# Patient Record
Sex: Female | Born: 1935 | Race: White | Hispanic: No | Marital: Married | Smoking: Former smoker
Health system: Southern US, Community
[De-identification: ages and names within clinical notes are randomized; demographics above are authoritative.]

## PROBLEM LIST (undated history)

## (undated) DIAGNOSIS — D649 Anemia, unspecified: Secondary | ICD-10-CM

## (undated) DIAGNOSIS — I340 Nonrheumatic mitral (valve) insufficiency: Secondary | ICD-10-CM

## (undated) DIAGNOSIS — J449 Chronic obstructive pulmonary disease, unspecified: Secondary | ICD-10-CM

## (undated) DIAGNOSIS — R001 Bradycardia, unspecified: Secondary | ICD-10-CM

## (undated) DIAGNOSIS — K219 Gastro-esophageal reflux disease without esophagitis: Secondary | ICD-10-CM

## (undated) DIAGNOSIS — IMO0002 Reserved for concepts with insufficient information to code with codable children: Secondary | ICD-10-CM

## (undated) DIAGNOSIS — I4891 Unspecified atrial fibrillation: Secondary | ICD-10-CM

## (undated) DIAGNOSIS — R079 Chest pain, unspecified: Secondary | ICD-10-CM

## (undated) DIAGNOSIS — F209 Schizophrenia, unspecified: Secondary | ICD-10-CM

## (undated) DIAGNOSIS — G8929 Other chronic pain: Secondary | ICD-10-CM

## (undated) DIAGNOSIS — I350 Nonrheumatic aortic (valve) stenosis: Secondary | ICD-10-CM

## (undated) DIAGNOSIS — G2401 Drug induced subacute dyskinesia: Secondary | ICD-10-CM

## (undated) DIAGNOSIS — R609 Edema, unspecified: Secondary | ICD-10-CM

## (undated) DIAGNOSIS — I10 Essential (primary) hypertension: Secondary | ICD-10-CM

## (undated) DIAGNOSIS — F323 Major depressive disorder, single episode, severe with psychotic features: Secondary | ICD-10-CM

## (undated) HISTORY — DX: Reserved for concepts with insufficient information to code with codable children: IMO0002

## (undated) HISTORY — DX: Edema, unspecified: R60.9

## (undated) HISTORY — PX: OTHER SURGICAL HISTORY: SHX169

## (undated) HISTORY — PX: HERNIA REPAIR: SHX51

## (undated) HISTORY — DX: Essential (primary) hypertension: I10

## (undated) HISTORY — PX: LAMINECTOMY: SHX219

## (undated) HISTORY — DX: Bradycardia, unspecified: R00.1

## (undated) HISTORY — DX: Major depressive disorder, single episode, severe with psychotic features: F32.3

## (undated) HISTORY — DX: Nonrheumatic mitral (valve) insufficiency: I34.0

---

## 2009-03-03 ENCOUNTER — Encounter: Payer: Self-pay | Admitting: Internal Medicine

## 2009-03-04 ENCOUNTER — Encounter: Admission: RE | Admit: 2009-03-04 | Discharge: 2009-03-04 | Payer: Self-pay | Admitting: Family Medicine

## 2009-03-09 DIAGNOSIS — I4891 Unspecified atrial fibrillation: Secondary | ICD-10-CM

## 2009-03-09 DIAGNOSIS — R609 Edema, unspecified: Secondary | ICD-10-CM | POA: Insufficient documentation

## 2009-03-09 DIAGNOSIS — R000 Tachycardia, unspecified: Secondary | ICD-10-CM | POA: Insufficient documentation

## 2009-03-10 ENCOUNTER — Ambulatory Visit: Payer: Self-pay | Admitting: Internal Medicine

## 2009-03-10 DIAGNOSIS — F17200 Nicotine dependence, unspecified, uncomplicated: Secondary | ICD-10-CM | POA: Insufficient documentation

## 2009-03-12 ENCOUNTER — Encounter: Admission: RE | Admit: 2009-03-12 | Discharge: 2009-03-12 | Payer: Self-pay | Admitting: Family Medicine

## 2009-03-15 ENCOUNTER — Ambulatory Visit: Payer: Self-pay | Admitting: Internal Medicine

## 2009-03-15 LAB — CONVERTED CEMR LAB: POC INR: 2.7

## 2009-03-19 ENCOUNTER — Encounter: Payer: Self-pay | Admitting: Internal Medicine

## 2009-03-19 ENCOUNTER — Ambulatory Visit: Payer: Self-pay | Admitting: Internal Medicine

## 2009-03-19 ENCOUNTER — Ambulatory Visit: Payer: Self-pay

## 2009-03-19 ENCOUNTER — Ambulatory Visit (HOSPITAL_COMMUNITY): Admission: RE | Admit: 2009-03-19 | Discharge: 2009-03-19 | Payer: Self-pay | Admitting: Internal Medicine

## 2009-03-22 ENCOUNTER — Ambulatory Visit: Payer: Self-pay | Admitting: Cardiology

## 2009-03-22 LAB — CONVERTED CEMR LAB: POC INR: 4.8

## 2009-03-29 ENCOUNTER — Ambulatory Visit: Payer: Self-pay | Admitting: Internal Medicine

## 2009-03-29 LAB — CONVERTED CEMR LAB: POC INR: 2.3

## 2009-04-02 ENCOUNTER — Ambulatory Visit: Payer: Self-pay | Admitting: Internal Medicine

## 2009-04-02 LAB — CONVERTED CEMR LAB
ALT: 15 units/L (ref 0–35)
AST: 19 units/L (ref 0–37)
Albumin: 4.2 g/dL (ref 3.5–5.2)
Alkaline Phosphatase: 109 units/L (ref 39–117)
BUN: 9 mg/dL (ref 6–23)
Basophils Absolute: 0 10*3/uL (ref 0.0–0.1)
Basophils Relative: 0.8 % (ref 0.0–3.0)
Bilirubin, Direct: 0.2 mg/dL (ref 0.0–0.3)
CO2: 30 meq/L (ref 19–32)
Calcium: 9.4 mg/dL (ref 8.4–10.5)
Chloride: 107 meq/L (ref 96–112)
Creatinine, Ser: 0.6 mg/dL (ref 0.4–1.2)
Eosinophils Absolute: 0.1 10*3/uL (ref 0.0–0.7)
Eosinophils Relative: 1.5 % (ref 0.0–5.0)
GFR calc non Af Amer: 103.63 mL/min (ref >60)
Glucose, Bld: 91 mg/dL (ref 70–99)
HCT: 38.9 % (ref 36.0–46.0)
Hemoglobin: 12.9 g/dL (ref 12.0–15.0)
INR: 1.2 — ABNORMAL HIGH (ref 0.8–1.0)
Lymphocytes Relative: 32.8 % (ref 12.0–46.0)
Lymphs Abs: 1.4 10*3/uL (ref 0.7–4.0)
MCHC: 33.1 g/dL (ref 30.0–36.0)
MCV: 96.1 fL (ref 78.0–100.0)
Magnesium: 2.2 mg/dL (ref 1.5–2.5)
Monocytes Absolute: 0.4 10*3/uL (ref 0.1–1.0)
Monocytes Relative: 8.8 % (ref 3.0–12.0)
Neutro Abs: 2.3 10*3/uL (ref 1.4–7.7)
Neutrophils Relative %: 56.1 % (ref 43.0–77.0)
Platelets: 160 10*3/uL (ref 150.0–400.0)
Potassium: 4.3 meq/L (ref 3.5–5.1)
Prothrombin Time: 12 s — ABNORMAL HIGH (ref 9.1–11.7)
RBC: 4.05 M/uL (ref 3.87–5.11)
RDW: 13.1 % (ref 11.5–14.6)
Sodium: 143 meq/L (ref 135–145)
TSH: 0.79 microintl units/mL (ref 0.35–5.50)
Total Bilirubin: 0.6 mg/dL (ref 0.3–1.2)
Total Protein: 7.6 g/dL (ref 6.0–8.3)
WBC: 4.2 10*3/uL — ABNORMAL LOW (ref 4.5–10.5)
aPTT: 30.8 s — ABNORMAL HIGH (ref 21.7–28.8)

## 2009-04-06 ENCOUNTER — Ambulatory Visit: Payer: Self-pay | Admitting: Cardiology

## 2009-04-06 ENCOUNTER — Ambulatory Visit (HOSPITAL_COMMUNITY): Admission: RE | Admit: 2009-04-06 | Discharge: 2009-04-06 | Payer: Self-pay | Admitting: Cardiology

## 2009-04-09 ENCOUNTER — Ambulatory Visit: Payer: Self-pay | Admitting: Cardiovascular Disease

## 2009-04-09 LAB — CONVERTED CEMR LAB: POC INR: 4

## 2009-04-14 ENCOUNTER — Ambulatory Visit: Payer: Self-pay | Admitting: Internal Medicine

## 2009-04-14 LAB — CONVERTED CEMR LAB: POC INR: 3.3

## 2009-04-21 ENCOUNTER — Ambulatory Visit: Payer: Self-pay | Admitting: Internal Medicine

## 2009-04-21 LAB — CONVERTED CEMR LAB: POC INR: 3.8

## 2009-04-30 ENCOUNTER — Ambulatory Visit: Payer: Self-pay | Admitting: Internal Medicine

## 2009-04-30 LAB — CONVERTED CEMR LAB: POC INR: 4.4

## 2009-05-06 ENCOUNTER — Ambulatory Visit: Payer: Self-pay | Admitting: Internal Medicine

## 2009-05-06 ENCOUNTER — Ambulatory Visit: Payer: Self-pay | Admitting: Cardiology

## 2009-05-06 DIAGNOSIS — I5032 Chronic diastolic (congestive) heart failure: Secondary | ICD-10-CM

## 2009-05-06 DIAGNOSIS — I059 Rheumatic mitral valve disease, unspecified: Secondary | ICD-10-CM | POA: Insufficient documentation

## 2009-05-06 DIAGNOSIS — I498 Other specified cardiac arrhythmias: Secondary | ICD-10-CM

## 2009-05-06 LAB — CONVERTED CEMR LAB: POC INR: 3.8

## 2009-05-13 ENCOUNTER — Ambulatory Visit: Payer: Self-pay | Admitting: Cardiology

## 2009-05-13 LAB — CONVERTED CEMR LAB: POC INR: 2.3

## 2009-05-27 ENCOUNTER — Ambulatory Visit: Payer: Self-pay | Admitting: Cardiology

## 2009-05-27 LAB — CONVERTED CEMR LAB: POC INR: 3

## 2009-06-24 ENCOUNTER — Ambulatory Visit: Payer: Self-pay | Admitting: Internal Medicine

## 2009-06-24 LAB — CONVERTED CEMR LAB: POC INR: 2

## 2009-07-12 ENCOUNTER — Ambulatory Visit: Payer: Self-pay | Admitting: Internal Medicine

## 2009-07-12 ENCOUNTER — Ambulatory Visit: Payer: Self-pay

## 2009-07-12 DIAGNOSIS — I10 Essential (primary) hypertension: Secondary | ICD-10-CM

## 2009-07-12 DIAGNOSIS — R42 Dizziness and giddiness: Secondary | ICD-10-CM

## 2009-07-12 LAB — CONVERTED CEMR LAB: POC INR: 1.8

## 2009-07-13 LAB — CONVERTED CEMR LAB
ALT: 13 units/L (ref 0–35)
AST: 16 units/L (ref 0–37)
Albumin: 4 g/dL (ref 3.5–5.2)
Alkaline Phosphatase: 84 units/L (ref 39–117)
Basophils Absolute: 0 10*3/uL (ref 0.0–0.1)
Basophils Relative: 0.4 % (ref 0.0–3.0)
Bilirubin, Direct: 0.2 mg/dL (ref 0.0–0.3)
Eosinophils Absolute: 0.1 10*3/uL (ref 0.0–0.7)
Eosinophils Relative: 1.1 % (ref 0.0–5.0)
Free T4: 1.22 ng/dL (ref 0.60–1.60)
HCT: 39 % (ref 36.0–46.0)
Hemoglobin: 13.1 g/dL (ref 12.0–15.0)
Lymphocytes Relative: 24.5 % (ref 12.0–46.0)
Lymphs Abs: 1.7 10*3/uL (ref 0.7–4.0)
MCHC: 33.7 g/dL (ref 30.0–36.0)
MCV: 94.4 fL (ref 78.0–100.0)
Monocytes Absolute: 0.7 10*3/uL (ref 0.1–1.0)
Monocytes Relative: 9.9 % (ref 3.0–12.0)
Neutro Abs: 4.6 10*3/uL (ref 1.4–7.7)
Neutrophils Relative %: 64.1 % (ref 43.0–77.0)
Platelets: 161 10*3/uL (ref 150.0–400.0)
Pro B Natriuretic peptide (BNP): 164 pg/mL — ABNORMAL HIGH (ref 0.0–100.0)
RBC: 4.13 M/uL (ref 3.87–5.11)
RDW: 16.2 % — ABNORMAL HIGH (ref 11.5–14.6)
TSH: 1.13 microintl units/mL (ref 0.35–5.50)
Total Bilirubin: 0.5 mg/dL (ref 0.3–1.2)
Total Protein: 6.8 g/dL (ref 6.0–8.3)
WBC: 7.1 10*3/uL (ref 4.5–10.5)

## 2009-08-02 ENCOUNTER — Ambulatory Visit: Payer: Self-pay | Admitting: Internal Medicine

## 2009-08-02 LAB — CONVERTED CEMR LAB: POC INR: 1.5

## 2009-08-16 ENCOUNTER — Ambulatory Visit: Payer: Self-pay | Admitting: Cardiology

## 2009-08-16 LAB — CONVERTED CEMR LAB: POC INR: 2.5

## 2009-09-06 ENCOUNTER — Ambulatory Visit: Payer: Self-pay | Admitting: Cardiology

## 2009-09-28 ENCOUNTER — Ambulatory Visit: Payer: Self-pay | Admitting: Internal Medicine

## 2009-09-28 LAB — CONVERTED CEMR LAB: POC INR: 3.8

## 2009-10-12 ENCOUNTER — Ambulatory Visit: Payer: Self-pay | Admitting: Cardiology

## 2009-10-12 LAB — CONVERTED CEMR LAB: POC INR: 2

## 2009-11-01 ENCOUNTER — Inpatient Hospital Stay (HOSPITAL_COMMUNITY)
Admission: EM | Admit: 2009-11-01 | Discharge: 2009-11-16 | Disposition: A | Discharge status: Other Acute Inpatient Hospital | Payer: Self-pay | Source: Home / Self Care | Admitting: Emergency Medicine

## 2009-12-14 ENCOUNTER — Ambulatory Visit: Payer: Self-pay | Admitting: Psychiatry

## 2009-12-18 ENCOUNTER — Ambulatory Visit: Payer: Self-pay | Admitting: Psychiatry

## 2010-01-14 ENCOUNTER — Ambulatory Visit: Payer: Self-pay | Admitting: Cardiology

## 2010-01-14 LAB — CONVERTED CEMR LAB: POC INR: 3.2

## 2010-01-21 ENCOUNTER — Ambulatory Visit: Payer: Self-pay | Admitting: Cardiovascular Disease

## 2010-01-21 LAB — CONVERTED CEMR LAB: POC INR: 1.8

## 2010-01-28 ENCOUNTER — Encounter: Payer: Self-pay | Admitting: Internal Medicine

## 2010-01-28 ENCOUNTER — Encounter: Payer: Self-pay | Admitting: Cardiology

## 2010-02-04 ENCOUNTER — Encounter: Payer: Self-pay | Admitting: Internal Medicine

## 2010-02-07 ENCOUNTER — Encounter: Payer: Self-pay | Admitting: Cardiology

## 2010-02-07 LAB — CONVERTED CEMR LAB: Prothrombin Time: 19.9 s

## 2010-02-09 ENCOUNTER — Encounter: Payer: Self-pay | Admitting: Internal Medicine

## 2010-02-09 ENCOUNTER — Ambulatory Visit
Admission: RE | Admit: 2010-02-09 | Discharge: 2010-02-09 | Payer: Self-pay | Source: Home / Self Care | Attending: Internal Medicine | Admitting: Internal Medicine

## 2010-02-14 ENCOUNTER — Encounter: Payer: Self-pay | Admitting: Internal Medicine

## 2010-02-14 ENCOUNTER — Encounter (INDEPENDENT_AMBULATORY_CARE_PROVIDER_SITE_OTHER): Payer: Self-pay | Admitting: Pharmacist

## 2010-02-14 ENCOUNTER — Encounter: Payer: Self-pay | Admitting: Cardiology

## 2010-02-21 ENCOUNTER — Encounter: Payer: Self-pay | Admitting: Internal Medicine

## 2010-02-21 ENCOUNTER — Encounter: Payer: Self-pay | Admitting: Cardiology

## 2010-02-21 LAB — CONVERTED CEMR LAB
POC INR: 4.6
Prothrombin Time: 55.5 s

## 2010-02-22 NOTE — Medication Information (Signed)
Summary: rov/cb  Anticoagulant Therapy  Managed by: Elaina Pattee, PharmD PCP: Dr. Manus Gunning Supervising MD: Eden Emms MD, Theron Arista Indication 1: Atrial Fibrillation Lab Used: [facility] [place] Site: [address] INR POC 2.3 INR RANGE 2.0-3.0  Dietary changes: no    Health status changes: no    Bleeding/hemorrhagic complications: no    Recent/future hospitalizations: no    Any changes in medication regimen? no    Recent/future dental: yes     Details: May be finishing dental work in future.  Any missed doses?: no       Is patient compliant with meds? yes       Allergies: 1)  ! Pcn 2)  ! Demerol 3)  ! Erythromycin 4)  ! Cleocin  Anticoagulation Management History:      The patient is taking warfarin and comes in today for a routine follow up visit.  Positive risk factors for bleeding include an age of 75 years or older.  The bleeding index is 'intermediate risk'.  Positive CHADS2 values include History of CHF.  Negative CHADS2 values include Age > 76 years old.  Her last INR was 1.2 ratio.  Anticoagulation responsible provider: Eden Emms MD, Theron Arista.  INR POC: 2.3.  Cuvette Lot#: [id number].  Exp: 06/2010.    Anticoagulation Management Assessment/Plan:      The patient's current anticoagulation dose is Warfarin sodium 5 mg tabs: take one daily as directed by coumadin clinic.  The target INR is 2.0-3.0.  The next INR is due 05/27/2009.  Results were reviewed/authorized by Elaina Pattee, PharmD.  She was notified by Elaina Pattee, PharmD.         Prior Anticoagulation Instructions: INR 3.8. Hold Coumadin today, then take 0.25 tablet on Friday, then take 0.5 tablet daily.   Current Anticoagulation Instructions: INR 2.3. Take 0.5 tablet daily. Recheck in 2 weeks.

## 2010-02-22 NOTE — Medication Information (Signed)
Summary: rov/eac  Anticoagulant Therapy  Managed by: Cloyde Reams, RN, BSN PCP: Dr. Manus Gunning Supervising MD: Shirlee Latch MD, Emmanuelle Coxe Indication 1: Atrial Fibrillation Lab Used: [facility] [place] Site: [address] INR POC 4.8 INR RANGE 2.0-3.0  Dietary changes: no    Health status changes: no    Bleeding/hemorrhagic complications: no    Recent/future hospitalizations: no    Any changes in medication regimen? no    Recent/future dental: no  Any missed doses?: no       Is patient compliant with meds? yes       Allergies: 1)  ! Pcn 2)  ! Demerol 3)  ! Erythromycin  Anticoagulation Management History:      The patient is taking warfarin and comes in today for a routine follow up visit.  Positive risk factors for bleeding include an age of 46 years or older.  The bleeding index is 'intermediate risk'.  Negative CHADS2 values include Age > 35 years old.  Anticoagulation responsible provider: Shirlee Latch MD, Althea Backs.  INR POC: 4.8.  Cuvette Lot#: [id number].  Exp: 05/2010.    Anticoagulation Management Assessment/Plan:      The patient's current anticoagulation dose is Warfarin sodium 5 mg tabs: take one daily as directed by coumadin clinic.  The target INR is 2.0-3.0.  The next INR is due 03/29/2009.  Results were reviewed/authorized by Cloyde Reams, RN, BSN.  She was notified by Cloyde Reams RN.         Prior Anticoagulation Instructions: INR 2.7  Take 1/2 tablet on Tuesday and take 1 tablet all other days.  Return to clinic in 1 week.  Current Anticoagulation Instructions: INR 4.8  Skip 2 doses of coumadin then start taking 1 tablet daily except 1/2 tablet on Tuesdays, Thursdays, and Saturday.

## 2010-02-22 NOTE — Assessment & Plan Note (Signed)
Summary: NEP/AFIB   Visit Type:  Initial Consult Primary Provider:  Dr. Manus Gunning  CC:  afib.  History of Present Illness: Kathryn Smith is a pleasant 77 yo WF with a h/o DDD who presents today for further evaluation of atrial fibrillation.  She reports having atrial fibrillation diagnosed 03/03/09 after presenting to Dr Randel Books office for evaluation of lower extremity edema.  The patient denies symptoms of palpitations and the onset of her afib is uncertain.  She denies chest pain, palpitations, shortness of breath, orthopnea, PND, presyncope, or syncope.  She reports mild lightheadedness yesterday.  She denies any other neurologic symptoms and has never had a stroke.  She reports that she is functionally limited presently by severe back pain. She reports significant stress in caring for her husband with dementia. She reports noticing edema in both ankles several weeks ago but feels that this has worsened recently.  Current Medications (verified): 1)  Abilify 5 Mg Tabs (Aripiprazole) .Marland Kitchen.. 1 Tab At Bedtime 2)  Amantadine Hcl 100 Mg Caps (Amantadine Hcl) .Marland Kitchen.. 1 Tab Once Daily 3)  Morphine Sulfate 30 Mg Tabs (Morphine Sulfate) .... 2 Tab Qam..1 Tab Afternoon..2 Tabs Qpm 4)  Tizanidine Hcl 4 Mg Tabs (Tizanidine Hcl) .Marland Kitchen.. 1 Tab Three Times A Day  Allergies (verified): 1)  ! Pcn 2)  ! Demerol 3)  ! Erythromycin  Past History:  Past Medical History: Persistent Atrial fibriilation Edema DDD Denies HTN, DM, HL, prior CVA, or CHF  Past Surgical History: Laminectomy 1976, 1978 C section 1968  Family History: none denies fh of arrhythmias  Social History: Pt lives in [place] with her husband who has dementia.  She moved here from [place] several months ago.  Her son is visiting from [place] where he works as a Engineer, site.  Retired Tax adviser.  Tob- smokes 1PPD x 50 years.  ETOH- 1 drink daily.  Drugs- none  Review of Systems       All systems are reviewed and negative except as  listed in the HPI.   also wears hearing aids  Vital Signs:  Patient profile:   77 year old female Height:      64 inches Weight:      126 pounds BMI:     21.71 Pulse rate:   94 / minute Pulse rhythm:   irregular BP sitting:   134 / 70  (left arm) Cuff size:   regular  Vitals Entered By: Danielle Rankin, CMA (March 10, 2009 2:49 PM)  Physical Exam  General:  Well developed, well nourished, in no acute distress. Head:  normocephalic and atraumatic Eyes:  PERRLA/EOM intact; conjunctiva and lids normal. Nose:  no deformity, discharge, inflammation, or lesions Mouth:  Teeth, gums and palate normal. Oral mucosa normal. Neck:  Neck supple, no JVD. No masses, thyromegaly or abnormal cervical nodes. Lungs:  Clear bilaterally to auscultation and percussion. Heart:  iRRR, no m/r/g Abdomen:  Bowel sounds positive; abdomen soft and non-tender without masses, organomegaly, or hernias noted. No hepatosplenomegaly. Msk:  slow  gait. Muscle strength and tone normal.  Pulses:  pulses normal in all 4 extremities Extremities:  No clubbing or cyanosis.  1+ ankle edema Neurologic:  Alert and oriented x 3.  CNII-XII intact, strength/sensation intact Skin:  Intact without lesions or rashes. Cervical Nodes:  no significant adenopathy Psych:  Normal affect.   EKG  Procedure date:  03/10/2009  Findings:      afib, V rate 94 bpm, anterior infarction pattern  Impression & Recommendations:  Problem # 1:  FIBRILLATION, ATRIAL (ICD-427.31)  The patient presents today for consultaiton regarding new onset afib.  She has persistent afib with symptoms of pedal edema.  Her heart rate today is 94.  Her CHADs 2 score is 1 (age).    Orders: Echocardiogram (Echo)  Her updated medication list for this problem includes:    Metoprolol Tartrate 25 Mg Tabs (Metoprolol tartrate) .Marland Kitchen... Take one tablet by mouth twice a day  Problem # 2:  EDEMA (ICD-782.3) The patient likely has diastolic dysfunction from  afib. We will start metprolol for better rate control. I have ordered an echo today to evaluate for structual heart disease. Further workup is pending her echo.  Problem # 3:  NICOTINE ADDICTION (ICD-305.1) tobacco cessation advised  Patient Instructions: 1)  Your physician recommends that you schedule a follow-up appointment in: 2 weeks with Dr Johney Frame 2)  Your physician has recommended you make the following change in your medication: Start Metoprlol 25mg  two times a day 3)  New medication to ask about is Pradaxa vs. Coumadin 4)  Your physician has requested that you have an echocardiogram.  Echocardiography is a painless test that uses sound waves to create images of your heart. It provides your doctor with information about the size and shape of your heart and how well your heart's chambers and valves are working.  This procedure takes approximately one hour. There are no restrictions for this procedure. Prescriptions: METOPROLOL TARTRATE 25 MG TABS (METOPROLOL TARTRATE) Take one tablet by mouth twice a day  #60 x 6   Entered by:   Dennis Bast, RN, BSN   Authorized by:   [name], MD   Signed by:   Dennis Bast, RN, BSN on 03/10/2009   Method used:   Electronically to        [facility] [address] [phone]* (retail)       [address]       [place], [place]  [address]       Ph: [phone] or [phone]       Fax: [phone]   RxID:   [id number]   Appended Document: NEP/AFIB pt called back wants to take Coumadin 5mg  daily will f/u in [facility] clinic on Monday

## 2010-02-22 NOTE — Medication Information (Signed)
Summary: rov/eac  Anticoagulant Therapy  Managed by: Cloyde Reams, RN, BSN PCP: Dr. Manus Gunning Supervising MD: Johney Frame MD, Fayrene Fearing Indication 1: Atrial Fibrillation Lab Used: [facility] [place] Site: [address] INR POC 3.3 INR RANGE 2.0-3.0  Dietary changes: no    Health status changes: no    Bleeding/hemorrhagic complications: no    Recent/future hospitalizations: no    Any changes in medication regimen? no    Recent/future dental: no  Any missed doses?: no       Is patient compliant with meds? yes       Allergies: 1)  ! Pcn 2)  ! Demerol 3)  ! Erythromycin 4)  ! Cleocin  Anticoagulation Management History:      The patient is taking warfarin and comes in today for a routine follow up visit.  Positive risk factors for bleeding include an age of 75 years or older.  The bleeding index is 'intermediate risk'.  Negative CHADS2 values include Age > 69 years old.  Her last INR was 1.2 ratio.  Anticoagulation responsible provider: Myleka Moncure MD, Fayrene Fearing.  INR POC: 3.3.  Cuvette Lot#: [id number].  Exp: 05/2010.    Anticoagulation Management Assessment/Plan:      The patient's current anticoagulation dose is Warfarin sodium 5 mg tabs: take one daily as directed by coumadin clinic.  The target INR is 2.0-3.0.  The next INR is due 04/21/2009.  Results were reviewed/authorized by Cloyde Reams, RN, BSN.  She was notified by Cloyde Reams RN.         Prior Anticoagulation Instructions: INR 4.0  Skip coumadin tomorrow and take 1/2 tablet on Sunday.  Then return to normal dosing schedule of 1/2 tablet on Tuesday, Thursday, and Saturday, and 1 tablet all other days.  Return to clinic in  5 days.    Current Anticoagulation Instructions: INR 3.3  Hold today's dosage of coumadin then start taking 1/2 tablet daily except 1 tablet on Mondays, Wednesdays, and Fridays.  Recheck in 1 week.

## 2010-02-22 NOTE — Medication Information (Signed)
Summary: rov/ewj  Anticoagulant Therapy  Managed by: Cloyde Reams, RN, BSN PCP: Dr. Manus Gunning Supervising MD: Jens Som MD, Arlys John Indication 1: Atrial Fibrillation Lab Used: [facility] [place] Site: [address] INR POC 2.5 INR RANGE 2.0-3.0  Dietary changes: no    Health status changes: no    Bleeding/hemorrhagic complications: no    Recent/future hospitalizations: no    Any changes in medication regimen? no    Recent/future dental: no  Any missed doses?: no       Is patient compliant with meds? yes       Allergies: 1)  ! Pcn 2)  ! Demerol 3)  ! Erythromycin 4)  ! Cleocin  Anticoagulation Management History:      The patient is taking warfarin and comes in today for a routine follow up visit.  Positive risk factors for bleeding include an age of 75 years or older.  The bleeding index is 'intermediate risk'.  Positive CHADS2 values include History of CHF, History of HTN, and Age > 20 years old.  Her last INR was 1.2 ratio.  Anticoagulation responsible provider: Jens Som MD, Arlys John.  INR POC: 2.5.  Cuvette Lot#: [id number].  Exp: 10/2010.    Anticoagulation Management Assessment/Plan:      The patient's current anticoagulation dose is Warfarin sodium 5 mg tabs: take one daily as directed by coumadin clinic.  The target INR is 2.0-3.0.  The next INR is due 09/06/2009.  Anticoagulation instructions were given to patient.  Results were reviewed/authorized by Cloyde Reams, RN, BSN.  She was notified by Cloyde Reams RN.         Prior Anticoagulation Instructions: INR 1.5  Take 1.5 tablets today, then start taking 1/2 tablet daily except 1 tablet on Mondays and Fridays.  Recheck in 2 weeks.    Current Anticoagulation Instructions: INR 2.5  Continue on same dosage 1/2 tablet daily except 1 tablet on Mondays and Fridays.  Recheck in 3 weeks.

## 2010-02-22 NOTE — Medication Information (Signed)
Summary: ROV/JAJ  Anticoagulant Therapy  Managed by: Lyna Poser, PharmD PCP: Dr. Manus Gunning Supervising MD: Gala Romney MD, Reuel Boom Indication 1: Atrial Fibrillation Lab Used: [facility] [place] Site: [address] INR POC 2 INR RANGE 2.0-3.0  Dietary changes: no    Health status changes: no    Bleeding/hemorrhagic complications: no    Recent/future hospitalizations: no    Any changes in medication regimen? no    Recent/future dental: no  Any missed doses?: yes     Details: may have missed 1. Not sure which day.  Is patient compliant with meds? yes       Allergies: 1)  ! Pcn 2)  ! Demerol 3)  ! Erythromycin 4)  ! Cleocin  Anticoagulation Management History:      Positive risk factors for bleeding include an age of 78 years or older.  The bleeding index is 'intermediate risk'.  Positive CHADS2 values include History of CHF, History of HTN, and Age > 53 years old.  Her last INR was 1.2 ratio.  Anticoagulation responsible provider: Bensimhon MD, Reuel Boom.  INR POC: 2.  Exp: 10/2010.    Anticoagulation Management Assessment/Plan:      The patient's current anticoagulation dose is Warfarin sodium 5 mg tabs: take one daily as directed by coumadin clinic.  The target INR is 2.0-3.0.  The next INR is due 11/08/2009.  Anticoagulation instructions were given to patient.  Results were reviewed/authorized by Lyna Poser, PharmD.         Prior Anticoagulation Instructions: INR 3.8  Skip today's dose. Then take 1/2 tablet every day except take 1 tablet on Fridays. Re-check INR in 2 weeks.    Current Anticoagulation Instructions: INR 2 No changes today. Continue take a half of a tablet everyday except on friday. On friday take a whole tablet. See you in 4 weeks.

## 2010-02-22 NOTE — Assessment & Plan Note (Signed)
Summary: 2wk f/u pt out of town until march 7th   Visit Type:  Follow-up Primary Provider:  Dr. Manus Gunning  CC:  afib.  History of Present Illness: The patient presents today for routine electrophysiology followup. She reports doing well since last being seen in our clinic. Her edema has improved.  She reports SOB and chest tightness with moderate activity.  The patient denies symptoms of palpitations, orthopnea, PND, dizziness, presyncope, syncope, or neurologic sequela. The patient is tolerating medications without difficulties and is otherwise without complaint today.   Current Medications (verified): 1)  Abilify 5 Mg Tabs (Aripiprazole) .Marland Kitchen.. 1 Tab At Bedtime 2)  Amantadine Hcl 100 Mg Caps (Amantadine Hcl) .Marland Kitchen.. 1 Tab Once Daily 3)  Morphine Sulfate 30 Mg Tabs (Morphine Sulfate) .... 2 Tab Qam..1 Tab Afternoon..2 Tabs Qpm Take With Tizanidine 4)  Tizanidine Hcl 4 Mg Tabs (Tizanidine Hcl) .Marland Kitchen.. 1 Tab Three Times A Day 5)  Metoprolol Tartrate 25 Mg Tabs (Metoprolol Tartrate) .... Take One Tablet By Mouth Twice A Day 6)  Warfarin Sodium 5 Mg Tabs (Warfarin Sodium) .... Take One Daily As Directed By Coumadin Clinic 7)  Norco 5-325 Mg Tabs (Hydrocodone-Acetaminophen) .... Take One Tablet Three Times A Day  Allergies (verified): 1)  ! Pcn 2)  ! Demerol 3)  ! Erythromycin 4)  ! Cleocin  Past History:  Past Medical History: Persistent Atrial fibriilation Edema DDD Moderate MR with moderate LA enlargment and moderate/severe RA enlargement Denies HTN, DM, HL, prior CVA, or CHF  Past Surgical History: Reviewed history from 03/10/2009 and no changes required. Laminectomy 1976, 1978 C section 1968  Family History: Reviewed history from 03/10/2009 and no changes required. none denies fh of arrhythmias  Social History: Reviewed history from 03/10/2009 and no changes required. Pt lives in [place] with her husband who has dementia.  She moved here from [place] several months ago.  Her son  is visiting from [place] where he works as a Engineer, site.  Retired Tax adviser.  Tob- smokes 1PPD x 50 years.  ETOH- 1 drink daily.  Drugs- none  Review of Systems       All systems are reviewed and negative except as listed in the HPI.   Vital Signs:  Patient profile:   77 year old female Height:      64 inches Weight:      131 pounds BMI:     22.57 Pulse rate:   92 / minute Pulse rhythm:   regular BP sitting:   147 / 87  (left arm) Cuff size:   regular  Vitals Entered By: Judithe Modest CMA (April 02, 2009 10:00 AM)  Physical Exam  General:  Well developed, well nourished, in no acute distress. Head:  normocephalic and atraumatic Eyes:  PERRLA/EOM intact; conjunctiva and lids normal. Mouth:  Teeth, gums and palate normal. Oral mucosa normal. Neck:  Neck supple, no JVD. No masses, thyromegaly or abnormal cervical nodes. Lungs:  Clear bilaterally to auscultation and percussion. Heart:  iRRR,  3/6 holosystolic murmur at the apex Abdomen:  Bowel sounds positive; abdomen soft and non-tender without masses, organomegaly, or hernias noted. No hepatosplenomegaly. Msk:  Back normal, normal gait. Muscle strength and tone normal. Pulses:  pulses normal in all 4 extremities Extremities:  No clubbing or cyanosis. Neurologic:  Alert and oriented x 3.  CNII-XII intact, strength/sensation intact Skin:  Intact without lesions or rashes. Cervical Nodes:  no significant adenopathy Psych:  Normal affect.    Echocardiogram  Procedure date:  03/19/2009  Findings:       Study Conclusions    - Left ventricle: The cavity size was normal. Systolic function was     normal. The estimated ejection fraction was in the range of 55% to     60%. Wall motion was normal; there were no regional wall motion     abnormalities. The study is not technically sufficient to allow     evaluation of LV diastolic function.   - Mitral valve: Moderate regurgitation.   - Left atrium: The atrium was  moderately dilated.   - Right atrium: The atrium was moderately to severely dilated.   - Atrial septum: There was increased thickness of the septum,     consistent with lipomatous hypertrophy.   Transthoracic echocardiography. M-mode, complete 2D, spectral   Doppler, and color Doppler. Height: Height: 162.6cm. Height: 64in.   Weight: Weight: 57.2kg. Weight: 125.7lb. Body mass index: BMI:   21.6kg/m^2. Body surface area: BSA: 1.53m^2. Blood pressure: 120/58.   Patient status: Outpatient. Location: [facility]  Left atrium: The atrium was moderately dilated.   Prepared and Electronically Authenticated by    Nicholes Mango, MD   [id number]     Signed by Laurance Flatten CMA on 04/01/2009 at 1:05 PM  ________________________________________________________________________     Echocardiogram  Procedure date:  03/19/2009  Findings:       Left atrium                          AP dim           42 mm     ------    AP dim index   2.61 cm/m^2 <2.2     EKG  Procedure date:  04/02/2009  Findings:      afib, V rate 79 bpm, nonspecific ST/T changes  Impression & Recommendations:  Problem # 1:  FIBRILLATION, ATRIAL (ICD-427.31)  The patient presents for further evaluation of symptomatic persistent afib.  Her ventricular rates have improved with metoprolol and CHF is also improving.  She has been therapeutic on coumadin for 3 wks.  We will therefore start amiodarone today.  I had a long discussion regarding the risks of this medicine with the patient and she consents to intiation of amiodarone.  We will check TFTs and LFTs today. Given her at least moderate MR as well as biatrial enlargement, we will plan TEE to better evaluation her MR.  She may have surgical MR.  We will consider RHC/LHC pending results of TEE.  R/B/A to TEE and cardioversion were discussed at length with the patient who wishes to proceed.    Her updated medication list for this problem includes:     Metoprolol Tartrate 25 Mg Tabs (Metoprolol tartrate) .Marland Kitchen... Take one tablet by mouth twice a day    Warfarin Sodium 5 Mg Tabs (Warfarin sodium) .Marland Kitchen... Take one daily as directed by coumadin clinic    Amiodarone Hcl 200 Mg Tabs (Amiodarone hcl) ..... One by mouth two times a day  Orders: TLB-BMP (Basic Metabolic Panel-BMET) (80048-METABOL) TLB-CBC Platelet - w/Differential (85025-CBCD) TLB-Magnesium (Mg) (83735-MG) TLB-PT (Protime) (85610-PTP) TLB-PTT (85730-PTTL) TLB-Hepatic/Liver Function Pnl (80076-HEPATIC) TLB-TSH (Thyroid Stimulating Hormone) (84443-TSH)  Problem # 2:  EDEMA (ICD-782.3)  The patient has acute on chronic disastolic dysfunction which appears to be improving with rate control.  We will obtain TEE as above.  Orders: TLB-BMP (Basic Metabolic Panel-BMET) (80048-METABOL) TLB-CBC Platelet - w/Differential (85025-CBCD) TLB-Magnesium (Mg) (83735-MG) TLB-PT (Protime) (85610-PTP) TLB-PTT (85730-PTTL)  TLB-Hepatic/Liver Function Pnl (80076-HEPATIC) TLB-TSH (Thyroid Stimulating Hormone) (84443-TSH)  Problem # 3:  TOBACCO ABUSE (ICD-305.1) cessation advised  Patient Instructions: 1)  Your physician recommends that you schedule a follow-up appointment in: 6 weeks with Dr Johney Frame 2)  Your physician has requested that you have a TEE/Cardioversion.  During a TEE, sound waves are used to create images of your heart. It provides your doctor with information about the size and shape of your heart and how well your heart's chambers and valves are working. In this test, a transducer is attached to the end of a flexible tube that is guided down your throat and into your esophagus (the tube leading from your mouth to your stomach) to get a more detailed image of your heart. Once the TEE has determined that a blood clot is not present, the cardioversion begins.  Electrical cardioversion uses a jolt of electricity to your heart either through paddles or wired patches attached to your chest. This  is a controlled, usually prescheduled, procedure. This procedure is done at the hospital and you are not awake during the procedure.  You usually go home the day of the procedure. Please see the instruction sheet given to you today for more information. 3)  Your physician recommends that you return for lab work today 4)  Your physician has recommended you make the following change in your medication: start Amiodarone 200mg  two times a day Prescriptions: AMIODARONE HCL 200 MG TABS (AMIODARONE HCL) one by mouth two times a day  #60 x 6   Entered by:   Dennis Bast, RN, BSN   Authorized by:   [name], MD   Signed by:   Dennis Bast, RN, BSN on 04/02/2009   Method used:   Electronically to        [facility] [phone]* (retail)       [address]       [place], [place]  [address]       Ph: [phone] or [phone]       Fax: [phone]   RxID:   [id number]

## 2010-02-22 NOTE — Medication Information (Signed)
Summary: rov/ewj  Anticoagulant Therapy  Managed by: Cloyde Reams, RN, BSN PCP: Dr. Manus Gunning Supervising MD: Gala Romney MD, Reuel Boom Indication 1: Atrial Fibrillation Lab Used: [facility] [place] Site: [address] INR POC 4.4 INR RANGE 2.0-3.0  Dietary changes: no    Health status changes: no    Bleeding/hemorrhagic complications: no    Recent/future hospitalizations: no    Any changes in medication regimen? no    Recent/future dental: no  Any missed doses?: no       Is patient compliant with meds? yes       Allergies: 1)  ! Pcn 2)  ! Demerol 3)  ! Erythromycin 4)  ! Cleocin  Anticoagulation Management History:      The patient is taking warfarin and comes in today for a routine follow up visit.  Positive risk factors for bleeding include an age of 75 years or older.  The bleeding index is 'intermediate risk'.  Negative CHADS2 values include Age > 27 years old.  Her last INR was 1.2 ratio.  Anticoagulation responsible provider: Fredi Hurtado MD, Reuel Boom.  INR POC: 4.4.  Cuvette Lot#: [id number].  Exp: 05/2010.    Anticoagulation Management Assessment/Plan:      The patient's current anticoagulation dose is Warfarin sodium 5 mg tabs: take one daily as directed by coumadin clinic.  The target INR is 2.0-3.0.  The next INR is due 05/06/2009.  Results were reviewed/authorized by Cloyde Reams, RN, BSN.  She was notified by Cloyde Reams RN.         Prior Anticoagulation Instructions: INR 3.8  Skip today's dosage then start taking 1/2 tablet daily except 1 tablet on Mondays and Fridays.  Recheck 7-10 days.   Current Anticoagulation Instructions: INR 4.4  Skip today's dosage of coumadin, then start taking 1/2 tablet daily except 1 tablet on Fridays.  Recheck in 1 week.

## 2010-02-22 NOTE — Medication Information (Signed)
Summary: rov/ewj  Anticoagulant Therapy  Managed by: Cloyde Reams, RN, BSN PCP: Dr. Manus Gunning Supervising MD: Johney Frame MD, Fayrene Fearing Indication 1: Atrial Fibrillation Lab Used: [facility] [place] Site: [address] INR POC 3.8 INR RANGE 2.0-3.0  Dietary changes: no    Health status changes: no    Bleeding/hemorrhagic complications: no    Recent/future hospitalizations: no    Any changes in medication regimen? no    Recent/future dental: no  Any missed doses?: no       Is patient compliant with meds? yes       Allergies: 1)  ! Pcn 2)  ! Demerol 3)  ! Erythromycin 4)  ! Cleocin  Anticoagulation Management History:      The patient is taking warfarin and comes in today for a routine follow up visit.  Positive risk factors for bleeding include an age of 75 years or older.  The bleeding index is 'intermediate risk'.  Negative CHADS2 values include Age > 92 years old.  Her last INR was 1.2 ratio.  Anticoagulation responsible provider: Antigone Crowell MD, Fayrene Fearing.  INR POC: 3.8.  Cuvette Lot#: [id number].  Exp: 05/2010.    Anticoagulation Management Assessment/Plan:      The patient's current anticoagulation dose is Warfarin sodium 5 mg tabs: take one daily as directed by coumadin clinic.  The target INR is 2.0-3.0.  The next INR is due 04/30/2009.  Results were reviewed/authorized by Cloyde Reams, RN, BSN.  She was notified by Cloyde Reams RN.         Prior Anticoagulation Instructions: INR 3.3  Hold today's dosage of coumadin then start taking 1/2 tablet daily except 1 tablet on Mondays, Wednesdays, and Fridays.  Recheck in 1 week.    Current Anticoagulation Instructions: INR 3.8  Skip today's dosage then start taking 1/2 tablet daily except 1 tablet on Mondays and Fridays.  Recheck 7-10 days.

## 2010-02-22 NOTE — Consult Note (Signed)
Summary: [facility]   Imported By: Marylou Mccoy 06/24/2009 09:06:28  _____________________________________________________________________  External Attachment:    Type:   Image     Comment:   External Document

## 2010-02-22 NOTE — Medication Information (Signed)
Summary: rov/cb  Anticoagulant Therapy  Managed by: Charolotte Eke, PharmD PCP: Dr. Manus Gunning Supervising MD: Shirlee Latch MD, Freida Busman Indication 1: Atrial Fibrillation Lab Used: [facility] [place] Site: [address] INR POC 3.0 INR RANGE 2.0-3.0  Dietary changes: no    Health status changes: no    Bleeding/hemorrhagic complications: no    Recent/future hospitalizations: no    Any changes in medication regimen? yes       Details: mucinex for cold/congestion/cough  Recent/future dental: no  Any missed doses?: no       Is patient compliant with meds? yes       Current Medications (verified): 1)  Abilify 5 Mg Tabs (Aripiprazole) .Marland Kitchen.. 1 Tab At Bedtime 2)  Amantadine Hcl 100 Mg Caps (Amantadine Hcl) .Marland Kitchen.. 1 Tab Once Daily 3)  Morphine Sulfate 30 Mg Tabs (Morphine Sulfate) .... 2 Tab Qam..1 Tab Afternoon..2 Tabs Qpm Take With Tizanidine 4)  Tizanidine Hcl 4 Mg Tabs (Tizanidine Hcl) .Marland Kitchen.. 1 Tab Three Times A Day 5)  Metoprolol Tartrate 25 Mg Tabs (Metoprolol Tartrate) .... Take One Tablet By Mouth At Bedtime 6)  Warfarin Sodium 5 Mg Tabs (Warfarin Sodium) .... Take One Daily As Directed By Coumadin Clinic 7)  Norco 5-325 Mg Tabs (Hydrocodone-Acetaminophen) .... Take One Tablet Three Times A Day 8)  Amiodarone Hcl 200 Mg Tabs (Amiodarone Hcl) .... One By Mouth At Bedtime  Allergies (verified): 1)  ! Pcn 2)  ! Demerol 3)  ! Erythromycin 4)  ! Cleocin  Anticoagulation Management History:      The patient is taking warfarin and comes in today for a routine follow up visit.  Positive risk factors for bleeding include an age of 46 years or older.  The bleeding index is 'intermediate risk'.  Positive CHADS2 values include History of CHF.  Negative CHADS2 values include Age > 104 years old.  Her last INR was 1.2 ratio.  Anticoagulation responsible provider: Shirlee Latch MD, Barack Nicodemus.  INR POC: 3.0.  Cuvette Lot#: [id number].  Exp: 06/2010.    Anticoagulation Management Assessment/Plan:    The patient's current anticoagulation dose is Warfarin sodium 5 mg tabs: take one daily as directed by coumadin clinic.  The target INR is 2.0-3.0.  The next INR is due 06/24/2009.  Results were reviewed/authorized by Charolotte Eke, PharmD.  She was notified by Charolotte Eke, PharmD.         Prior Anticoagulation Instructions: INR 2.3. Take 0.5 tablet daily. Recheck in 2 weeks.  Current Anticoagulation Instructions: The patient is to continue with the same dose of coumadin.  This dosage includes: one-half tablet daily

## 2010-02-22 NOTE — Medication Information (Signed)
Summary: rov/jk  Anticoagulant Therapy  Managed by: Weston Brass, PharmD PCP: Dr. Manus Gunning Supervising MD: Gala Romney MD, Reuel Boom Indication 1: Atrial Fibrillation Lab Used: [facility] [place] Site: [address] INR POC 3.8 INR RANGE 2.0-3.0  Dietary changes: no    Health status changes: no    Bleeding/hemorrhagic complications: no    Recent/future hospitalizations: no    Any changes in medication regimen? no    Recent/future dental: no  Any missed doses?: no       Is patient compliant with meds? yes       Allergies: 1)  ! Pcn 2)  ! Demerol 3)  ! Erythromycin 4)  ! Cleocin  Anticoagulation Management History:      The patient is taking warfarin and comes in today for a routine follow up visit.  Positive risk factors for bleeding include an age of 55 years or older.  The bleeding index is 'intermediate risk'.  Positive CHADS2 values include History of CHF, History of HTN, and Age > 24 years old.  Her last INR was 1.2 ratio.  Anticoagulation responsible provider: Bensimhon MD, Reuel Boom.  INR POC: 3.8.  Cuvette Lot#: [id number].  Exp: 10/2010.    Anticoagulation Management Assessment/Plan:      The patient's current anticoagulation dose is Warfarin sodium 5 mg tabs: take one daily as directed by coumadin clinic.  The target INR is 2.0-3.0.  The next INR is due 10/12/2009.  Anticoagulation instructions were given to patient.  Results were reviewed/authorized by Weston Brass, PharmD.  She was notified by Harrel Carina, PharmD candidate.         Prior Anticoagulation Instructions: INR 3.2  Take 1/2 tablet (2.5mg ) tonight.  Then resume normal schedule of taking 1/2 tablet (2.5mg ) every day except take 1 tablet (5mg ) on Mondays and Fridays.  Recheck in 3 weeks.   Current Anticoagulation Instructions: INR 3.8  Skip today's dose. Then take 1/2 tablet every day except take 1 tablet on Fridays. Re-check INR in 2 weeks.

## 2010-02-22 NOTE — Medication Information (Signed)
Summary: rov/ewj  Anticoagulant Therapy  Managed by: Cloyde Reams, RN, BSN PCP: Dr. Manus Gunning Supervising MD: Tenny Craw MD, Gunnar Fusi Indication 1: Atrial Fibrillation Lab Used: [facility] [place] Site: [address] INR POC 1.5 INR RANGE 2.0-3.0  Dietary changes: no    Health status changes: no    Bleeding/hemorrhagic complications: no    Recent/future hospitalizations: no    Any changes in medication regimen? no    Recent/future dental: no  Any missed doses?: no       Is patient compliant with meds? yes       Allergies: 1)  ! Pcn 2)  ! Demerol 3)  ! Erythromycin 4)  ! Cleocin  Anticoagulation Management History:      The patient is taking warfarin and comes in today for a routine follow up visit.  Positive risk factors for bleeding include an age of 75 years or older.  The bleeding index is 'intermediate risk'.  Positive CHADS2 values include History of CHF, History of HTN, and Age > 23 years old.  Her last INR was 1.2 ratio.  Anticoagulation responsible provider: Tenny Craw MD, Gunnar Fusi.  INR POC: 1.5.  Cuvette Lot#: [id number].  Exp: 09/2010.    Anticoagulation Management Assessment/Plan:      The patient's current anticoagulation dose is Warfarin sodium 5 mg tabs: take one daily as directed by coumadin clinic.  The target INR is 2.0-3.0.  The next INR is due 08/16/2009.  Anticoagulation instructions were given to patient.  Results were reviewed/authorized by Cloyde Reams, RN, BSN.  She was notified by Cloyde Reams RN.         Prior Anticoagulation Instructions: INR 1.8  Start taking 1/2 tablet daily except 1 tablet on Mondays. Recheck in 3 weeks.    Current Anticoagulation Instructions: INR 1.5  Take 1.5 tablets today, then start taking 1/2 tablet daily except 1 tablet on Mondays and Fridays.  Recheck in 2 weeks.

## 2010-02-22 NOTE — Medication Information (Signed)
Summary: rov-tp  Anticoagulant Therapy  Managed by: Weston Brass, PharmD PCP: Dr. Manus Gunning Supervising MD: Gala Romney MD, Reuel Boom Indication 1: Atrial Fibrillation Lab Used: [facility] [place] Site: [address] INR POC 2.0 INR RANGE 2.0-3.0  Dietary changes: no    Health status changes: no    Bleeding/hemorrhagic complications: no    Recent/future hospitalizations: no    Any changes in medication regimen? no    Recent/future dental: no  Any missed doses?: no       Is patient compliant with meds? yes       Allergies: 1)  ! Pcn 2)  ! Demerol 3)  ! Erythromycin 4)  ! Cleocin  Anticoagulation Management History:      The patient is taking warfarin and comes in today for a routine follow up visit.  Positive risk factors for bleeding include an age of 75 years or older.  The bleeding index is 'intermediate risk'.  Positive CHADS2 values include History of CHF.  Negative CHADS2 values include Age > 14 years old.  Her last INR was 1.2 ratio.  Anticoagulation responsible provider: Bensimhon MD, Reuel Boom.  INR POC: 2.0.  Cuvette Lot#: [id number].  Exp: 08/2010.    Anticoagulation Management Assessment/Plan:      The patient's current anticoagulation dose is Warfarin sodium 5 mg tabs: take one daily as directed by coumadin clinic.  The target INR is 2.0-3.0.  The next INR is due 07/22/2009.  Anticoagulation instructions were given to patient.  Results were reviewed/authorized by Weston Brass, PharmD.  She was notified by Weston Brass PharmD.         Prior Anticoagulation Instructions: The patient is to continue with the same dose of coumadin.  This dosage includes: one-half tablet daily  Current Anticoagulation Instructions: INR 2.0  Continue same dose of 1/2 tablet every day.

## 2010-02-22 NOTE — Medication Information (Signed)
Summary: new coumadin eval  Anticoagulant Therapy  Managed by: Eda Keys, PharmD PCP: Dr. Ihor Austin MD: Eden Emms MD, Theron Arista Indication 1: Atrial Fibrillation [place] Site: [address] INR POC 2.7 INR RANGE 2.0-3.0          Comments: Patient is a new start to coumadin.  Saw Dr. Johney Frame recently on 03/10/09 and was started on coumadin at that time.  Patient took first dose of coumadin on Wed 2/16 or Thurs 2/17.  I have provided patient education regarding coumadin therapy.    Current Medications (verified): 1)  Abilify 5 Mg Tabs (Aripiprazole) .Marland Kitchen.. 1 Tab At Bedtime 2)  Amantadine Hcl 100 Mg Caps (Amantadine Hcl) .Marland Kitchen.. 1 Tab Once Daily 3)  Morphine Sulfate 30 Mg Tabs (Morphine Sulfate) .... 2 Tab Qam..1 Tab Afternoon..2 Tabs Qpm 4)  Tizanidine Hcl 4 Mg Tabs (Tizanidine Hcl) .Marland Kitchen.. 1 Tab Three Times A Day 5)  Metoprolol Tartrate 25 Mg Tabs (Metoprolol Tartrate) .... Take One Tablet By Mouth Twice A Day 6)  Warfarin Sodium 5 Mg Tabs (Warfarin Sodium) .... Take One Daily As Directed By Coumadin Clinic  Allergies (verified): 1)  ! Pcn 2)  ! Demerol 3)  ! Erythromycin  Anticoagulation Management History:      The patient comes in today for her initial visit for anticoagulation therapy.  Positive risk factors for bleeding include an age of 75 years or older.  The bleeding index is 'intermediate risk'.  Negative CHADS2 values include Age > 75 years old.  Anticoagulation responsible provider: Eden Emms MD, Theron Arista.  INR POC: 2.7.  Cuvette Lot#: [id number].  Exp: 04/2010.    Anticoagulation Management Assessment/Plan:      The patient's current anticoagulation dose is Warfarin sodium 5 mg tabs: take one daily as directed by coumadin clinic.  The next INR is due 03/22/2009.  Results were reviewed/authorized by Eda Keys, PharmD.  She was notified by Eda Keys.         Current Anticoagulation Instructions: INR 2.7  Take 1/2 tablet on Tuesday and take 1 tablet all other  days.  Return to clinic in 1 week.

## 2010-02-22 NOTE — Letter (Signed)
Summary: Cardioversion/TEE Instructions  Architectural technologist, Main Office  [address]   [place], [place] [address]   Phone: [phone]  Fax: [phone]    Cardioversion / TEE Cardioversion Instructions  You are scheduled for a Cardioversion / TEE Cardioversion on 04/06/09 with Dr. Shirlee Latch.   Please arrive at the [facility] at 12:00 p.m. on the day of your procedure.  1)   DIET:  A)   Nothing to eat or drink after midnight except your medications with a sip of water. have  2)   Come to the [place] office on 04/02/09 for lab work. The lab at [facility] is open from 8:30 a.m. to 1:30 p.m. and 2:30 p.m. to 5:00 p.m. The lab at [address] is open from 7:30 a.m. to 5:30 p.m. You do not have to be fasting.  3)   MAKE SURE YOU TAKE YOUR COUMADIN.  B)   YOU MAY TAKE ALL of your remaining medications with a small amount of water.    C)   START NEW medications: Amiodarone 200mg  one tablet twice daily      _  5)  Must have a responsible person to drive you home.  6)   Bring a current list of your medications and current insurance cards.   * Special Note:  Every effort is made to have your procedure done on time. Occasionally there are emergencies that present themselves at the hospital that may cause delays. Please be patient if a delay does occur.  * If you have any questions after you get home, please call the office at [phone]. Anselm Pancoast

## 2010-02-22 NOTE — Medication Information (Signed)
Summary: Kathryn Smith  Anticoagulant Therapy  Managed by: Elaina Pattee, PharmD PCP: Dr. Manus Gunning Supervising MD: Eden Emms MD, Theron Arista Indication 1: Atrial Fibrillation Lab Used: [facility] [place] Site: [address] INR POC 3.8 INR RANGE 2.0-3.0  Dietary changes: no    Health status changes: no    Bleeding/hemorrhagic complications: no    Recent/future hospitalizations: no    Any changes in medication regimen? no    Recent/future dental: no  Any missed doses?: no       Is patient compliant with meds? yes         Current Medications (verified): 1)  Abilify 5 Mg Tabs (Aripiprazole) .Marland Kitchen.. 1 Tab At Bedtime 2)  Amantadine Hcl 100 Mg Caps (Amantadine Hcl) .Marland Kitchen.. 1 Tab Once Daily 3)  Morphine Sulfate 30 Mg Tabs (Morphine Sulfate) .... 2 Tab Qam..1 Tab Afternoon..2 Tabs Qpm Take With Tizanidine 4)  Tizanidine Hcl 4 Mg Tabs (Tizanidine Hcl) .Marland Kitchen.. 1 Tab Three Times A Day 5)  Metoprolol Tartrate 25 Mg Tabs (Metoprolol Tartrate) .... Take One Tablet By Mouth Twice A Day 6)  Warfarin Sodium 5 Mg Tabs (Warfarin Sodium) .... Take One Daily As Directed By Coumadin Clinic 7)  Norco 5-325 Mg Tabs (Hydrocodone-Acetaminophen) .... Take One Tablet Three Times A Day 8)  Amiodarone Hcl 200 Mg Tabs (Amiodarone Hcl) .... One By Mouth Two Times A Day  Allergies: 1)  ! Pcn 2)  ! Demerol 3)  ! Erythromycin 4)  ! Cleocin  Anticoagulation Management History:      The patient is taking warfarin and comes in today for a routine follow up visit.  Positive risk factors for bleeding include an age of 75 years or older.  The bleeding index is 'intermediate risk'.  Negative CHADS2 values include Age > 44 years old.  Her last INR was 1.2 ratio.  Anticoagulation responsible provider: Eden Emms MD, Theron Arista.  INR POC: 3.8.  Cuvette Lot#: [id number].  Exp: 05/2010.    Anticoagulation Management Assessment/Plan:      The patient's current anticoagulation dose is Warfarin sodium 5 mg tabs: take one daily as  directed by coumadin clinic.  The target INR is 2.0-3.0.  The next INR is due 05/13/2009.  Results were reviewed/authorized by Elaina Pattee, PharmD.  She was notified by Elaina Pattee, PharmD.         Prior Anticoagulation Instructions: INR 4.4  Skip today's dosage of coumadin, then start taking 1/2 tablet daily except 1 tablet on Fridays.  Recheck in 1 week.  Current Anticoagulation Instructions: INR 3.8. Hold Coumadin today, then take 0.25 tablet on Friday, then take 0.5 tablet daily.

## 2010-02-22 NOTE — Assessment & Plan Note (Signed)
Summary: 6wk f/u sl   Primary Provider:  Dr. Manus Gunning  CC:  6 week follow up.  Pt states she is feeling good.  Marland Kitchen  History of Present Illness: The patient presents today for routine electrophysiology followup. She reports improvement in SOB and edema since her cardioversion.  Presently, she reports fatigue and "feeling tired".   The patient denies symptoms of palpitations, chest pain, shortness of breath, orthopnea, PND, lower extremity edema, dizziness, presyncope, syncope, or neurologic sequela. The patient is tolerating medications without difficulties and is otherwise without complaint today.   Current Medications (verified): 1)  Abilify 5 Mg Tabs (Aripiprazole) .Marland Kitchen.. 1 Tab At Bedtime 2)  Amantadine Hcl 100 Mg Caps (Amantadine Hcl) .Marland Kitchen.. 1 Tab Once Daily 3)  Morphine Sulfate 30 Mg Tabs (Morphine Sulfate) .... 2 Tab Qam..1 Tab Afternoon..2 Tabs Qpm Take With Tizanidine 4)  Tizanidine Hcl 4 Mg Tabs (Tizanidine Hcl) .Marland Kitchen.. 1 Tab Three Times A Day 5)  Metoprolol Tartrate 25 Mg Tabs (Metoprolol Tartrate) .... Take One Tablet By Mouth Twice A Day 6)  Warfarin Sodium 5 Mg Tabs (Warfarin Sodium) .... Take One Daily As Directed By Coumadin Clinic 7)  Norco 5-325 Mg Tabs (Hydrocodone-Acetaminophen) .... Take One Tablet Three Times A Day 8)  Amiodarone Hcl 200 Mg Tabs (Amiodarone Hcl) .... One By Mouth Two Times A Day 9)  Lovenox 60 Mg/0.43ml Soln (Enoxaparin Sodium) .... Inject One Syringe Subcutaneously Into Abdomen Two Times A Day  Allergies (verified): 1)  ! Pcn 2)  ! Demerol 3)  ! Erythromycin 4)  ! Cleocin  Past History:  Family History: Last updated: 03/10/2009 none denies fh of arrhythmias  Social History: Last updated: 03/10/2009 Pt lives in [place] with her husband who has dementia.  She moved here from [place] several months ago.  Her son is visiting from [place] where he works as a Engineer, site.  Retired Tax adviser.  Tob- smokes 1PPD x 50 years.  ETOH- 1 drink daily.  Drugs-  none  Past Medical History: Persistent Atrial fibriilation Sinus bradycardia Edema DDD Moderate MR with moderate LA enlargment and moderate/severe RA enlargement Denies HTN, DM, HL, prior CVA, or CHF  Past Surgical History: Reviewed history from 03/10/2009 and no changes required. Laminectomy 1976, 1978 C section 1968  Social History: Reviewed history from 03/10/2009 and no changes required. Pt lives in [place] with her husband who has dementia.  She moved here from [place] several months ago.  Her son is visiting from [place] where he works as a Engineer, site.  Retired Tax adviser.  Tob- smokes 1PPD x 50 years.  ETOH- 1 drink daily.  Drugs- none  Review of Systems       All systems are reviewed and negative except as listed in the HPI.   Vital Signs:  Patient profile:   77 year old female Height:      64 inches Weight:      126 pounds BMI:     21.71 Pulse rate:   41 / minute Pulse rhythm:   regular BP sitting:   136 / 72  (left arm) Cuff size:   regular  Vitals Entered By: Judithe Modest CMA (May 06, 2009 9:11 AM)  Physical Exam  General:  Well developed, well nourished, in no acute distress. Head:  normocephalic and atraumatic Eyes:  PERRLA/EOM intact; conjunctiva and lids normal. Mouth:  Teeth, gums and palate normal. Oral mucosa normal. Neck:  supple, JVP 8cm Lungs:  Clear bilaterally to auscultation and percussion. Heart:  brady,  regular rhythm, 3/6 holosystolic murmur Abdomen:  Bowel sounds positive; abdomen soft and non-tender without masses, organomegaly, or hernias noted. No hepatosplenomegaly. Msk:  Back normal, normal gait. Muscle strength and tone normal. Pulses:  pulses normal in all 4 extremities Extremities:  No clubbing or cyanosis. Neurologic:  Alert and oriented x 3. Skin:  Intact without lesions or rashes. Psych:  Normal affect.   EKG  Procedure date:  05/06/2009  Findings:      sinus bradycardia 41 bpm, PR 218, QTc 440  Impression  & Recommendations:  Problem # 1:  FIBRILLATION, ATRIAL (ICD-427.31) maintaining sinus rhythm decrease amiodarone to 200mg  daily today decrease metoprolol to 25mg  daily (pt to take at night). continue coumadin (goal INR 2-3)  we will check tfts and lfts upon return  Problem # 2:  BRADYCARDIA (ICD-427.89) symptomatic bradycardia we will decrease amiodarone and metoprolol as above will continue to wean these (possibly stop metoprolol and decrease amiodarone to 100mg  daily) if necessary  Problem # 3:  CHRONIC DIASTOLIC HEART FAILURE (ICD-428.32) improved with sinus rhythm salt restriction  Problem # 4:  TOBACCO ABUSE (ICD-305.1) advised to quit pt not ready to quit yet  Problem # 5:  MITRAL VALVE DISORDERS (ICD-424.0) at least moderate MR may eventually need surgery,  will follow echo's every 6 months  Other Orders: EKG w/ Interpretation (93000)  Patient Instructions: 1)  Your physician recommends that you schedule a follow-up appointment in: 2 months with Dr Johney Frame 2)  Your physician has recommended you make the following change in your medication: decrease Amiodarone to once daily at night and decrease Metoprolol to once daily at night

## 2010-02-22 NOTE — Assessment & Plan Note (Signed)
Summary: 2 month rov.sl   Visit Type:  2 mo f/u Primary Provider:  Dr. Manus Gunning  CC:  dizziness...edema/ankles....  History of Present Illness: The patient presents today for routine electrophysiology followup.  She reports that over the past few days that she has had orthostatic dizziness.  She is unaware of any further afib.  The patient denies symptoms of palpitations, chest pain, shortness of breath, orthopnea, PND, lower extremity edema, dizziness, presyncope, syncope, or neurologic sequela. The patient is tolerating medications without difficulties and is otherwise without complaint today.   Current Medications (verified): 1)  Abilify 10 Mg Tabs (Aripiprazole) .Marland Kitchen.. 1 Tab At Bedtime 2)  Amantadine Hcl 100 Mg Caps (Amantadine Hcl) .Marland Kitchen.. 1 Tab Once Daily 3)  Avinza 45 Mg Xr24h-Cap (Morphine Sulfate Beads) .Marland Kitchen.. 1 Tab Three Times A Day 4)  Tizanidine Hcl 4 Mg Tabs (Tizanidine Hcl) .Marland Kitchen.. 1 Tab Three Times A Day 5)  Metoprolol Tartrate 25 Mg Tabs (Metoprolol Tartrate) .Marland Kitchen.. 1 Tab Once Daily 6)  Warfarin Sodium 5 Mg Tabs (Warfarin Sodium) .... Take One Daily As Directed By Coumadin Clinic 7)  Amiodarone Hcl 200 Mg Tabs (Amiodarone Hcl) .Marland Kitchen.. 1 Tab Two Times A Day  Allergies: 1)  ! Pcn 2)  ! Demerol 3)  ! Erythromycin 4)  ! Cleocin  Past History:  Past Medical History: Persistent Atrial fibriilation Sinus bradycardia Edema DDD Moderate MR with moderate LA enlargment and moderate/severe RA enlargement HTN  Past Surgical History: Reviewed history from 03/10/2009 and no changes required. Laminectomy 1976, 1978 C section 1968  Social History: Reviewed history from 03/10/2009 and no changes required. Pt lives in [place] with her husband who has dementia.  She moved here from [place] several months ago.  Her son is visiting from [place] where he works as a Engineer, site.  Retired Tax adviser.  Tob- smokes 1PPD x 50 years.  ETOH- 1 drink daily.  Drugs- none  Review of Systems   All systems are reviewed and negative except as listed in the HPI.   Vital Signs:  Patient profile:   77 year old female Height:      64 inches Weight:      126 pounds BMI:     21.71 Pulse rate:   51 / minute Pulse (ortho):   89 / minute Pulse rhythm:   irregular BP sitting:   164 / 82  (left arm) BP standing:   186 / 84 Cuff size:   regular  Vitals Entered By: Danielle Rankin, CMA (July 12, 2009 11:29 AM)  Serial Vital Signs/Assessments:  Time      Position  BP       Pulse  Resp  Temp     By 11:46 AM  Lying LA  190/77   67                    Danielle Rankin, CMA 11:48 AM  Sitting   175/70   67                    Danielle Rankin, CMA 11:50 AM  Standing  186/84   89                    Danielle Rankin, CMA  Comments: 11:46 AM dizzy By: Danielle Rankin, CMA  11:48 AM pt states she feels anxious By: Danielle Rankin, CMA  11:50 AM pt very shakey and dizzy By: Danielle Rankin, CMA    Physical Exam  General:  elderly, NAD Head:  normocephalic and atraumatic Eyes:  PERRLA/EOM intact; conjunctiva and lids normal. Mouth:  Teeth, gums and palate normal. Oral mucosa normal. Neck:  Neck supple, no JVD. No masses, thyromegaly or abnormal cervical nodes. Lungs:  Clear bilaterally to auscultation and percussion. Heart:  brady, regular rhythm, 3/6 holosystolic murmur Abdomen:  Bowel sounds positive; abdomen soft and non-tender without masses, organomegaly, or hernias noted. No hepatosplenomegaly. Msk:  Back normal, normal gait. Muscle strength and tone normal. Pulses:  pulses normal in all 4 extremities Extremities:  No clubbing or cyanosis. Neurologic:  Alert and oriented x 3. Skin:  Intact without lesions or rashes.   EKG  Procedure date:  07/12/2009  Findings:      sinus braydycardia 50 bpm, PR 210, nonspecific ST/T changes  Impression & Recommendations:  Problem # 1:  FIBRILLATION, ATRIAL (ICD-427.31) Maintaining sinus rhythm. continue coumadin for stroke prevention (CHADS2 score is  2) decrease amiodarone to 100mg  daily check TFTs and LFTs today  Problem # 2:  CHRONIC DIASTOLIC HEART FAILURE (ICD-428.32)  stable  Her updated medication list for this problem includes:    Metoprolol Tartrate 25 Mg Tabs (Metoprolol tartrate) .Marland Kitchen... 1 tab once daily    Warfarin Sodium 5 Mg Tabs (Warfarin sodium) .Marland Kitchen... Take one daily as directed by coumadin clinic    Amiodarone Hcl 200 Mg Tabs (Amiodarone hcl) .Marland Kitchen... 1/2 tab once daily    Lisinopril 10 Mg Tabs (Lisinopril) ..... One by mouth once daily  Orders: TLB-BNP (B-Natriuretic Peptide) (83880-BNPR) TLB-CBC Platelet - w/Differential (85025-CBCD) TLB-TSH (Thyroid Stimulating Hormone) (84443-TSH) TLB-T4 (Thyrox), Free [phone]) TLB-Hepatic/Liver Function Pnl (80076-HEPATIC)  Problem # 3:  TOBACCO ABUSE (ICD-305.1) cessation advised today  Problem # 4:  ESSENTIAL HYPERTENSION, BENIGN (ICD-401.1)  Her updated medication list for this problem includes:    Metoprolol Tartrate 25 Mg Tabs (Metoprolol tartrate) .Marland Kitchen... 1 tab once daily    Lisinopril 10 Mg Tabs (Lisinopril) ..... One by mouth once daily  Her updated medication list for this problem includes:    Metoprolol Tartrate 25 Mg Tabs (Metoprolol tartrate) .Marland Kitchen... 1 tab once daily  Problem # 5:  DIZZINESS (ICD-780.4) orthostatic by history and mildly orthostatic by HR today. adequate hydration advised check BMET, electrolytes, and TFTs today decrease amiodarone to 100mg  daily pt to contact our office if dizziness does not improve  Problem # 6:  MITRAL VALVE DISORDERS (ICD-424.0) repeat echo in 6-8 months  Patient Instructions: 1)  Your physician recommends that you schedule a follow-up appointment in: 4 months with Dr Bonney Aid 2)  Your physician recommends that you return for lab work today 3)  Your physician has recommended you make the following change in your medication: decrease Amiodarone to 100mg  daily and start Lisinopril 10mg  daily 4)  Keep well hydrated  Prescriptions: LISINOPRIL 10 MG TABS (LISINOPRIL) one by mouth once daily  #30 x 11   Entered by:   Dennis Bast, RN, BSN   Authorized by:   [name], MD   Signed by:   Dennis Bast, RN, BSN on 07/12/2009   Method used:   Electronically to        [facility] [address] [phone]* (retail)       [address]       [place], [place]  [address]       Ph: [phone] or [phone]       Fax: [phone]   RxID:   [id number]

## 2010-02-22 NOTE — Medication Information (Signed)
Summary: rov/ewj  Anticoagulant Therapy  Managed by: Weston Brass, PharmD PCP: Dr. Ihor Austin MD: Myrtis Ser MD, Tinnie Gens Indication 1: Atrial Fibrillation Lab Used: [facility] [place] Site: [address] INR RANGE 2.0-3.0  Dietary changes: no    Health status changes: no    Bleeding/hemorrhagic complications: no    Recent/future hospitalizations: no    Any changes in medication regimen? no    Recent/future dental: no  Any missed doses?: no       Is patient compliant with meds? yes       Allergies: 1)  ! Pcn 2)  ! Demerol 3)  ! Erythromycin 4)  ! Cleocin  Anticoagulation Management History:      The patient is taking warfarin and comes in today for a routine follow up visit.  Positive risk factors for bleeding include an age of 12 years or older.  The bleeding index is 'intermediate risk'.  Positive CHADS2 values include History of CHF, History of HTN, and Age > 40 years old.  Her last INR was 1.2 ratio.  Anticoagulation responsible provider: Myrtis Ser MD, Tinnie Gens.  Cuvette Lot#: [id number].  Exp: 10/2010.    Anticoagulation Management Assessment/Plan:      The patient's current anticoagulation dose is Warfarin sodium 5 mg tabs: take one daily as directed by coumadin clinic.  The target INR is 2.0-3.0.  The next INR is due 09/27/2009.  Anticoagulation instructions were given to patient.  Results were reviewed/authorized by Weston Brass, PharmD.  She was notified by Gweneth Fritter, PharmD Candidate.         Prior Anticoagulation Instructions: INR 2.5  Continue on same dosage 1/2 tablet daily except 1 tablet on Mondays and Fridays.  Recheck in 3 weeks.    Current Anticoagulation Instructions: INR 3.2  Take 1/2 tablet (2.5mg ) tonight.  Then resume normal schedule of taking 1/2 tablet (2.5mg ) every day except take 1 tablet (5mg ) on Mondays and Fridays.  Recheck in 3 weeks.

## 2010-02-22 NOTE — Medication Information (Signed)
Summary: rov/ewj  Anticoagulant Therapy  Managed by: Eda Keys, PharmD PCP: Dr. Ihor Austin MD: Ladona Ridgel MD, Sharlot Gowda Indication 1: Atrial Fibrillation Lab Used: [facility] [place] Site: [address] INR POC 2.3 INR RANGE 2.0-3.0  Dietary changes: no    Health status changes: no    Bleeding/hemorrhagic complications: no    Recent/future hospitalizations: no    Any changes in medication regimen? no    Recent/future dental: no  Any missed doses?: no       Is patient compliant with meds? yes       Allergies: 1)  ! Pcn 2)  ! Demerol 3)  ! Erythromycin  Anticoagulation Management History:      The patient is taking warfarin and comes in today for a routine follow up visit.  Positive risk factors for bleeding include an age of 75 years or older.  The bleeding index is 'intermediate risk'.  Negative CHADS2 values include Age > 75 years old.  Anticoagulation responsible provider: Ladona Ridgel MD, Sharlot Gowda.  INR POC: 2.3.  Cuvette Lot#: [id number].  Exp: 05/2010.    Anticoagulation Management Assessment/Plan:      The patient's current anticoagulation dose is Warfarin sodium 5 mg tabs: take one daily as directed by coumadin clinic.  The target INR is 2.0-3.0.  The next INR is due 04/12/2009.  Results were reviewed/authorized by Eda Keys, PharmD.  She was notified by Eda Keys.         Prior Anticoagulation Instructions: INR 4.8  Skip 2 doses of coumadin then start taking 1 tablet daily except 1/2 tablet on Tuesdays, Thursdays, and Saturday.    Current Anticoagulation Instructions: INR 2.3  Continue current dosing schedule.  Take 1/2 tablet on Tuesday, Thursday, and Saturday and take 1 tablet all other days.  Return to clinic in 2 weeks.

## 2010-02-22 NOTE — Medication Information (Signed)
Summary: rov/sp  Anticoagulant Therapy  Managed by: Cloyde Reams, RN, BSN PCP: Dr. Manus Gunning Supervising MD: Shirlee Latch MD, Ysabel Stankovich Indication 1: Atrial Fibrillation Lab Used: [facility]  Site: [address] INR POC 1.8 INR RANGE 2.0-3.0  Dietary changes: no    Health status changes: no    Bleeding/hemorrhagic complications: no    Recent/future hospitalizations: no    Any changes in medication regimen? no    Recent/future dental: no  Any missed doses?: no       Is patient compliant with meds? yes       Allergies: 1)  ! Pcn 2)  ! Demerol 3)  ! Erythromycin 4)  ! Cleocin  Anticoagulation Management History:      The patient is taking warfarin and comes in today for a routine follow up visit.  Positive risk factors for bleeding include an age of 75 years or older.  The bleeding index is 'intermediate risk'.  Positive CHADS2 values include History of CHF and Age > 40 years old.  Her last INR was 1.2 ratio.  Anticoagulation responsible provider: Shirlee Latch MD, Christinamarie Tall.  INR POC: 1.8.  Cuvette Lot#: [id number].  Exp: 09/2010.    Anticoagulation Management Assessment/Plan:      The patient's current anticoagulation dose is Warfarin sodium 5 mg tabs: take one daily as directed by coumadin clinic.  The target INR is 2.0-3.0.  The next INR is due 08/02/2009.  Anticoagulation instructions were given to patient.  Results were reviewed/authorized by Cloyde Reams, RN, BSN.  She was notified by Cloyde Reams RN.         Prior Anticoagulation Instructions: INR 2.0  Continue same dose of 1/2 tablet every day.   Current Anticoagulation Instructions: INR 1.8  Start taking 1/2 tablet daily except 1 tablet on Mondays. Recheck in 3 weeks.

## 2010-02-22 NOTE — Medication Information (Signed)
Summary: ccr  Anticoagulant Therapy  Managed by: Eda Keys, PharmD PCP: Dr. Ihor Austin MD: Ladona Ridgel MD, Sharlot Gowda Indication 1: Atrial Fibrillation Lab Used: [facility] [place] Site: [address] INR POC 4.0 INR RANGE 2.0-3.0  Dietary changes: no    Health status changes: no    Bleeding/hemorrhagic complications: no    Recent/future hospitalizations: no    Any changes in medication regimen? yes       Details: Pt has been on lovenox bridge and has 1 more injection  Recent/future dental: no  Any missed doses?: no       Is patient compliant with meds? yes      Comments: Pt recently underwent cardioversion this past tuesday 04/06/09, and was placed back on coumadin with lovenox bridge the day following the procedure (04/07/09).    Allergies: 1)  ! Pcn 2)  ! Demerol 3)  ! Erythromycin 4)  ! Cleocin  Anticoagulation Management History:      The patient is taking warfarin and comes in today for a routine follow up visit.  Positive risk factors for bleeding include an age of 75 years or older.  The bleeding index is 'intermediate risk'.  Negative CHADS2 values include Age > 75 years old.  Her last INR was 1.2 ratio.  Anticoagulation responsible provider: Ladona Ridgel MD, Sharlot Gowda.  INR POC: 4.0.  Cuvette Lot#: [id number].  Exp: 05/2010.    Anticoagulation Management Assessment/Plan:      The patient's current anticoagulation dose is Warfarin sodium 5 mg tabs: take one daily as directed by coumadin clinic.  The target INR is 2.0-3.0.  The next INR is due 04/14/2009.  Results were reviewed/authorized by Eda Keys, PharmD.  She was notified by Eda Keys.         Prior Anticoagulation Instructions: INR 2.3  Continue current dosing schedule.  Take 1/2 tablet on Tuesday, Thursday, and Saturday and take 1 tablet all other days.  Return to clinic in 2 weeks.    Current Anticoagulation Instructions: INR 4.0  Skip coumadin tomorrow and take 1/2 tablet on  Sunday.  Then return to normal dosing schedule of 1/2 tablet on Tuesday, Thursday, and Saturday, and 1 tablet all other days.  Return to clinic in  5 days.

## 2010-02-24 NOTE — Medication Information (Signed)
Summary: rov/tm  Anticoagulant Therapy  Managed by: Cloyde Reams, RN, BSN PCP: Dr. Ihor Austin MD: Clifton James MD, Cristal Deer Indication 1: Atrial Fibrillation Lab Used: [facility] [place] Site: [address] INR POC 1.8 INR RANGE 2.0-3.0  Dietary changes: no    Health status changes: yes       Details: LE edema present.   Bleeding/hemorrhagic complications: no    Recent/future hospitalizations: no    Any changes in medication regimen? no    Recent/future dental: no  Any missed doses?: no       Is patient compliant with meds? yes      Comments: Pt now resides at [facility] and would like to have PT/INR checked there at the facility and results faxed to [place].    Allergies: 1)  ! Pcn 2)  ! Demerol 3)  ! Erythromycin 4)  ! Cleocin  Anticoagulation Management History:      The patient is taking warfarin and comes in today for a routine follow up visit.  Positive risk factors for bleeding include an age of 25 years or older.  The bleeding index is 'intermediate risk'.  Positive CHADS2 values include History of CHF, History of HTN, and Age > 75 years old.  Her last INR was 1.2 ratio.  Anticoagulation responsible provider: Clifton James MD, Cristal Deer.  INR POC: 1.8.  Cuvette Lot#: [id number].  Exp: 02/2011.    Anticoagulation Management Assessment/Plan:      The patient's current anticoagulation dose is Warfarin sodium 5 mg tabs: take one daily as directed by coumadin clinic.  The target INR is 2.0-3.0.  The next INR is due 01/28/2010.  Anticoagulation instructions were given to patient/son .  Results were reviewed/authorized by Cloyde Reams, RN, BSN.  She was notified by Cloyde Reams RN.         Prior Anticoagulation Instructions: INR 3.2 Tomorrow only take 2.5mg s , then change dose to 4.5mg s everyday. Recheck in one week.   Current Anticoagulation Instructions: INR 1.8  Start taking 4.5mg  daily except 5mg  on Saturdays.  Recheck in 1 week.

## 2010-02-24 NOTE — Assessment & Plan Note (Signed)
Summary: rov.gd   Visit Type:  Follow-up Primary Provider:  Royanne Foots   History of Present Illness: Kathryn Smith presents after a recent prolonged hospitalization 10/11 for depression with psychotic features.  She appears to have recovered from this.  Per the discharge summary, she had prolonged QT interval during her hospitalization which was felt to be due to amantadine, abilify, and amiodarone.  her amantadine and abilify were discontinued.  The patient states that she is doing well from a cardiac standpoint.  She denies symptoms of palpitations, chest pain, shortness of breath, orthopnea, PND, lower extremity edema, dizziness, presyncope, syncope, or neurologic sequela. The patient is tolerating medications without difficulties and is otherwise without complaint today.   Current Medications (verified): 1)  Amantadine Hcl 100 Mg Caps (Amantadine Hcl) .Marland Kitchen.. 1 Tab Once Daily 2)  Avinza 45 Mg Xr24h-Cap (Morphine Sulfate Beads) .Marland Kitchen.. 1 Tab Three Times A Day 3)  Warfarin Sodium 5 Mg Tabs (Warfarin Sodium) .... Take One Daily As Directed By Coumadin Clinic 4)  Pacerone 100 Mg Tabs (Amiodarone Hcl) .... Take 1 Tablet Daily 5)  Lisinopril 10 Mg Tabs (Lisinopril) .... One By Mouth Once Daily 6)  Warfarin Sodium 2 Mg Tabs (Warfarin Sodium) .... Use As Directed By Anticoagualtion Clinic 7)  Effexor Xr 75 Mg Xr24h-Cap (Venlafaxine Hcl) .... Take 1 Tablet By Mouth Once A Day 8)  Cerefolin Nac 6-2-600 Mg Tabs (Methylfol-Methylcob-Acetylcyst) .... Take One Daily 9)  Zyprexa 10 Mg Tabs (Olanzapine) .... Take One Daily 10)  Trazodone Hcl 50 Mg Tabs (Trazodone Hcl) .... Take One in The Evening 11)  Xanax 0.25 Mg Tabs (Alprazolam) .... Take 1/2 Two Times A Day 12)  Kathryn Contin 30 Mg Xr12h-Tab (Morphine Sulfate) .... One Every Six Hours As Needed 13)  Senna 187 Mg Tabs (Senna) .... Take Two in [place]  Allergies (verified): 1)  ! Pcn 2)  ! Demerol 3)  ! Erythromycin 4)  ! Cleocin  Past  History:  Past Medical History: Persistent Atrial fibriilation Sinus bradycardia Edema DDD Moderate MR with moderate LA enlargment and moderate/severe RA enlargement HTN Depression with recent hospitalization for psychosis Iatrogenic prolonged QT, resolved  Past Surgical History: Reviewed history from 03/10/2009 and no changes required. Laminectomy 1976, 1978 C section 1968  Social History: Reviewed history from 03/10/2009 and no changes required. Pt lives in [place] with her husband who has dementia.  She moved here from [place] several months ago.  Her son is visiting from [place] where he works as a Engineer, site.  Retired Tax adviser.  Tob- smokes 1PPD x 50 years.  ETOH- 1 drink daily.  Drugs- none  Review of Systems       All systems are reviewed and negative except as listed in the HPI.   Vital Signs:  Patient profile:   77 year old female Height:      64 inches Weight:      144 pounds Pulse rate:   80 / minute BP sitting:   140 / 72  (right arm)  Vitals Entered By: Jacquelin Hawking, CMA (February 09, 2010 2:25 PM)  Physical Exam  General:  elderly, NAD Head:  normocephalic and atraumatic Eyes:  PERRLA/EOM intact; conjunctiva and lids normal. Mouth:  Teeth, gums and palate normal. Oral mucosa normal. Neck:  Neck supple, no JVD. No masses, thyromegaly or abnormal cervical nodes. Lungs:  Clear bilaterally to auscultation and percussion. Heart:  regular rhythm, 3/6 holosystolic murmur Abdomen:  Bowel sounds positive; abdomen soft and non-tender without masses,  organomegaly, or hernias noted. No hepatosplenomegaly. Msk:  Back normal, normal gait. Muscle strength and tone normal. Extremities:  No clubbing or cyanosis. Neurologic:  Alert and oriented x 3. Skin:  Intact without lesions or rashes. Psych:  Normal affect.  euthymic mood   EKG  Procedure date:  02/09/2010  Findings:      sinus rhythm 74 bpm, PR 180, QRS 94, Qtc 450, otherwise normal  ekg  Impression & Recommendations:  Problem # 1:  MITRAL VALVE DISORDERS (ICD-424.0) She has chronic mitral regurgiation.  We will repeat echo to further evaluate upon return.  Problem # 2:  FIBRILLATION, ATRIAL (ICD-427.31) stable we will continue on amiodarone 100mg  daily  QT is stable today  continue coumadin longterm  Problem # 3:  ESSENTIAL HYPERTENSION, BENIGN (ICD-401.1) stable no changes  Problem # 4:  CHRONIC DIASTOLIC HEART FAILURE (ICD-428.32) stable no changes  Problem # 5:  TOBACCO ABUSE (ICD-305.1) cessation encouragd she has not smoked since 10/11!  Other Orders: Echocardiogram (Echo)  Patient Instructions: 1)  Your physician recommends that you schedule a follow-up appointment in: 8 weeks with Dr Johney Frame 2)  Your physician has recommended you make the following change in your medication: change Pacerone to 100mg  daily 3)  Your physician has requested that you have an echocardiogram.  Echocardiography is a painless test that uses sound waves to create images of your heart. It provides your doctor with information about the size and shape of your heart and how well your heart's chambers and valves are working.  This procedure takes approximately one hour. There are no restrictions for this procedure.(sameday as appointment) Prescriptions: PACERONE 100 MG TABS (AMIODARONE HCL) Take 1 tablet daily  #30 x 11   Entered by:   Dennis Bast, RN, BSN   Authorized by:   [name], MD   Signed by:   Dennis Bast, RN, BSN on 02/09/2010   Method used:   Electronically to        [facility] [phone]* (retail)       [phone] [address]       [place], [place]  [address]       Ph: [phone]       Fax: [phone]   RxID:   [id number]

## 2010-02-24 NOTE — Medication Information (Signed)
Summary: COUMADIN CK/MT  Anticoagulant Therapy  Managed by: Bethena Midget, RN, BSN PCP: Dr. Ihor Austin MD: Riley Kill MD, Maisie Fus Indication 1: Atrial Fibrillation Lab Used: [facility] [place] Site: [address] INR POC 3.2 INR RANGE 2.0-3.0  Dietary changes: no    Health status changes: no    Bleeding/hemorrhagic complications: no    Recent/future hospitalizations: yes       Details: See below  Any changes in medication regimen? no    Recent/future dental: no  Any missed doses?: no       Is patient compliant with meds? yes      Comments: Was in hospital and discharge to [facility] and has been there for past 30 days. Daughter states pt has been taking 4.5mg s daily except 5mg s on M&F.  She has 2mg , 2.5mg s and 5mg s tablets at home.   Allergies: 1)  ! Pcn 2)  ! Demerol 3)  ! Erythromycin 4)  ! Cleocin  Anticoagulation Management History:      The patient is taking warfarin and comes in today for a routine follow up visit.  Positive risk factors for bleeding include an age of 75 years or older.  The bleeding index is 'intermediate risk'.  Positive CHADS2 values include History of CHF, History of HTN, and Age > 29 years old.  Her last INR was 1.2 ratio.  Anticoagulation responsible Manon Banbury: Riley Kill MD, Maisie Fus.  INR POC: 3.2.  Cuvette Lot#: [id number].  Exp: 02/2011.    Anticoagulation Management Assessment/Plan:      The patient's current anticoagulation dose is Warfarin sodium 5 mg tabs: take one daily as directed by coumadin clinic.  The target INR is 2.0-3.0.  The next INR is due 01/21/2010.  Anticoagulation instructions were given to patient/son .  Results were reviewed/authorized by Bethena Midget, RN, BSN.  She was notified by Bethena Midget, RN, BSN.         Prior Anticoagulation Instructions: INR 2 No changes today. Continue take a half of a tablet everyday except on friday. On friday take a whole tablet. See you in 4 weeks.  Current Anticoagulation  Instructions: INR 3.2 Tomorrow only take 2.5mg s , then change dose to 4.5mg s everyday. Recheck in one week.

## 2010-02-24 NOTE — Medication Information (Signed)
Summary: Coumadin Clinic  Anticoagulant Therapy  Managed by: Cloyde Reams, RN, BSN PCP: Dr. Ihor Austin MD: Tenny Craw MD, Gunnar Fusi Indication 1: Atrial Fibrillation Lab Used: [facility] [place] Site: [address] PT 19.9 INR POC 1.7 INR RANGE 2.0-3.0    Bleeding/hemorrhagic complications: no     Any changes in medication regimen? no     Any missed doses?: no       Is patient compliant with meds? yes       Allergies: 1)  ! Pcn 2)  ! Demerol 3)  ! Erythromycin 4)  ! Cleocin  Anticoagulation Management History:      Her anticoagulation is being managed by telephone today.  Positive risk factors for bleeding include an age of 37 years or older.  The bleeding index is 'intermediate risk'.  Positive CHADS2 values include History of CHF, History of HTN, and Age > 79 years old.  Her last INR was 1.2 ratio.  Prothrombin time is 19.9.  Anticoagulation responsible provider: Tenny Craw MD, Gunnar Fusi.  INR POC: 1.7.  Exp: 02/2011.    Anticoagulation Management Assessment/Plan:      The patient's current anticoagulation dose is Warfarin sodium 5 mg tabs: take one daily as directed by coumadin clinic, Warfarin sodium 2 mg tabs: Use as directed by Anticoagualtion Clinic.  The target INR is 2.0-3.0.  The next INR is due 02/07/2010.  Anticoagulation instructions were given to patient/son .  Results were reviewed/authorized by Cloyde Reams, RN, BSN.  She was notified by Cloyde Reams RN.        Coagulation management information includes: Pt's daughter Joya Martyr [phone] call with dosage instructions.  Prior Anticoagulation Instructions: INR 1.8  Start taking 4.5mg  daily except 5mg  on Saturdays.  Recheck in 1 week.    Current Anticoagulation Instructions: INR 1.7  Called spoke with pt's daughter advised to have pt take 5mg  x 2 doses, then start taking 4.5mg  daily except 5mg  on Tuesdays and Saturdays.  Recheck in 10 days.

## 2010-02-24 NOTE — Medication Information (Signed)
Summary: Coumadin Clinic  Anticoagulant Therapy  Managed by: Weston Brass, PharmD PCP: Royanne Foots Supervising MD: Daleen Squibb MD, Maisie Fus Indication 1: Atrial Fibrillation Lab Used: [facility] [place] Site: [address] PT 23.0 INR POC 1.9 INR RANGE 2.0-3.0  Dietary changes: no    Health status changes: no    Bleeding/hemorrhagic complications: no    Recent/future hospitalizations: no    Any changes in medication regimen? yes       Details: change in pain medications  Recent/future dental: no  Any missed doses?: no       Is patient compliant with meds? yes       Allergies: 1)  ! Pcn 2)  ! Demerol 3)  ! Erythromycin 4)  ! Cleocin  Anticoagulation Management History:      Her anticoagulation is being managed by telephone today.  Positive risk factors for bleeding include an age of 53 years or older.  The bleeding index is 'intermediate risk'.  Positive CHADS2 values include History of CHF, History of HTN, and Age > 84 years old.  Her last INR was 1.2 ratio.  Prothrombin time is 23.0.  Anticoagulation responsible provider: Daleen Squibb MD, Maisie Fus.  INR POC: 1.9.  Exp: 02/2011.    Anticoagulation Management Assessment/Plan:      The patient's current anticoagulation dose is Warfarin sodium 5 mg tabs: take one daily as directed by coumadin clinic, Warfarin sodium 2 mg tabs: Use as directed by Anticoagualtion Clinic.  The target INR is 2.0-3.0.  The next INR is due 02/14/2010.  Anticoagulation instructions were given to patient/son .  Results were reviewed/authorized by Weston Brass, PharmD.  She was notified by Weston Brass PharmD.         Prior Anticoagulation Instructions: INR 1.7  Called spoke with pt's daughter Randa Evens, advised to have pt start taking 5mg  daily.  Recheck in 1 week.  Orders faxed back to [facility].   Current Anticoagulation Instructions: INR 1.9  Spoke with pt's daughter.  Take 7.5mg  today then resume 5mg  daily.  Recheck INR in 1 week.  Orders faxed to  [facility].  Weston Brass PharmD  February 14, 2010 8:29 AM

## 2010-02-24 NOTE — Medication Information (Signed)
Summary: Coumadin Clinic  Anticoagulant Therapy  Managed by: Cloyde Reams, RN, BSN PCP: Dr. Ihor Austin MD: Tenny Craw MD, Gunnar Fusi Indication 1: Atrial Fibrillation Lab Used: [facility] [place] Site: [address] PT 19.9 INR POC 1.7 INR RANGE 2.0-3.0    Bleeding/hemorrhagic complications: no     Any changes in medication regimen? yes       Details: Aleve prn, cautioned against incr risk of internal bleeding.    Any missed doses?: no       Is patient compliant with meds? yes       Allergies: 1)  ! Pcn 2)  ! Demerol 3)  ! Erythromycin 4)  ! Cleocin  Anticoagulation Management History:      Her anticoagulation is being managed by telephone today.  Positive risk factors for bleeding include an age of 32 years or older.  The bleeding index is 'intermediate risk'.  Positive CHADS2 values include History of CHF, History of HTN, and Age > 21 years old.  Her last INR was 1.2 ratio.  Prothrombin time is 19.9.  Anticoagulation responsible provider: Tenny Craw MD, Gunnar Fusi.  INR POC: 1.7.  Exp: 02/2011.    Anticoagulation Management Assessment/Plan:      The patient's current anticoagulation dose is Warfarin sodium 5 mg tabs: take one daily as directed by coumadin clinic, Warfarin sodium 2 mg tabs: Use as directed by Anticoagualtion Clinic.  The target INR is 2.0-3.0.  The next INR is due 02/14/2010.  Anticoagulation instructions were given to patient/son .  Results were reviewed/authorized by Cloyde Reams, RN, BSN.  She was notified by Cloyde Reams RN.         Prior Anticoagulation Instructions: INR 1.7  Called spoke with pt's daughter advised to have pt take 5mg  x 2 doses, then start taking 4.5mg  daily except 5mg  on Tuesdays and Saturdays.  Recheck in 10 days.    Current Anticoagulation Instructions: INR 1.7  Called spoke with pt's daughter Randa Evens, advised to have pt start taking 5mg  daily.  Recheck in 1 week.  Orders faxed back to [facility].

## 2010-02-28 ENCOUNTER — Encounter: Payer: Self-pay | Admitting: Internal Medicine

## 2010-02-28 ENCOUNTER — Encounter: Payer: Self-pay | Admitting: Cardiology

## 2010-02-28 LAB — CONVERTED CEMR LAB
POC INR: 1.7
Prothrombin Time: 20.5 s

## 2010-03-02 NOTE — Medication Information (Signed)
Summary: Coumadin Clinic  Anticoagulant Therapy  Managed by: Cloyde Reams, RN, BSN PCP: Royanne Foots Supervising MD: Antoine Poche MD, Fayrene Fearing Indication 1: Atrial Fibrillation Lab Used: [facility] [place] Site: [address] PT 55.5 INR POC 4.6 INR RANGE 2.0-3.0  Dietary changes: no    Health status changes: yes       Details: Pt had GI virus Monday last week, Thurs and Friday frequent stools.    Bleeding/hemorrhagic complications: no    Recent/future hospitalizations: no    Any changes in medication regimen? no    Recent/future dental: no  Any missed doses?: no       Is patient compliant with meds? yes       Allergies: 1)  ! Pcn 2)  ! Demerol 3)  ! Erythromycin 4)  ! Cleocin  Anticoagulation Management History:      Her anticoagulation is being managed by telephone today.  Positive risk factors for bleeding include an age of 2 years or older.  The bleeding index is 'intermediate risk'.  Positive CHADS2 values include History of CHF, History of HTN, and Age > 75 years old.  Her last INR was 1.2 ratio.  Prothrombin time is 55.5.  Anticoagulation responsible provider: Antoine Poche MD, Fayrene Fearing.  INR POC: 4.6.  Exp: 02/2011.    Anticoagulation Management Assessment/Plan:      The patient's current anticoagulation dose is Warfarin sodium 5 mg tabs: take one daily as directed by coumadin clinic, Warfarin sodium 2 mg tabs: Use as directed by Anticoagualtion Clinic.  The target INR is 2.0-3.0.  The next INR is due 02/28/2010.  Anticoagulation instructions were given to patient/son .  Results were reviewed/authorized by Cloyde Reams, RN, BSN.  She was notified by Cloyde Reams RN.         Prior Anticoagulation Instructions: INR 1.9  Spoke with pt's daughter.  Take 7.5mg  today then resume 5mg  daily.  Recheck INR in 1 week.  Orders faxed to [facility].  Weston Brass PharmD  February 14, 2010 8:29 AM   Current Anticoagulation Instructions: INR 4.6  Called spoke with pt's  daughter Randa Evens, advised to have pt hold 2 doses of Coumadin, then resume same dosage 5mg  daily.  Recheck in 1 week.  Orders faxed to [facility].

## 2010-03-07 ENCOUNTER — Encounter (INDEPENDENT_AMBULATORY_CARE_PROVIDER_SITE_OTHER): Payer: Self-pay | Admitting: Pharmacist

## 2010-03-07 ENCOUNTER — Encounter: Payer: Self-pay | Admitting: Cardiology

## 2010-03-07 ENCOUNTER — Encounter: Payer: Self-pay | Admitting: Internal Medicine

## 2010-03-07 LAB — CONVERTED CEMR LAB: Prothrombin Time: 25.8 s

## 2010-03-10 NOTE — Medication Information (Signed)
Summary: Coumadin Clinic  Anticoagulant Therapy  Managed by: Weston Brass, PharmD PCP: Royanne Foots Supervising MD: Myrtis Ser MD, Tinnie Gens Indication 1: Atrial Fibrillation Lab Used: [facility]  [place] Site: [address] PT 20.5 INR POC 1.7 INR RANGE 2.0-3.0  Dietary changes: no    Health status changes: no    Bleeding/hemorrhagic complications: no    Recent/future hospitalizations: no    Any changes in medication regimen? yes       Details: change in pain medications  Recent/future dental: no  Any missed doses?: no       Is patient compliant with meds? yes       Allergies: 1)  ! Pcn 2)  ! Demerol 3)  ! Erythromycin 4)  ! Cleocin  Anticoagulation Management History:      Her anticoagulation is being managed by telephone today.  Positive risk factors for bleeding include an age of 75 years or older.  The bleeding index is 'intermediate risk'.  Positive CHADS2 values include History of CHF, History of HTN, and Age > 67 years old.  Her last INR was 1.2 ratio.  Prothrombin time is 20.5.  Anticoagulation responsible provider: Myrtis Ser MD, Tinnie Gens.  INR POC: 1.7.  Exp: 02/2011.    Anticoagulation Management Assessment/Plan:      The patient's current anticoagulation dose is Warfarin sodium 5 mg tabs: take one daily as directed by coumadin clinic, Warfarin sodium 2 mg tabs: Use as directed by Anticoagualtion Clinic.  The target INR is 2.0-3.0.  The next INR is due 03/07/2010.  Anticoagulation instructions were given to patient/son .  Results were reviewed/authorized by Weston Brass, PharmD.  She was notified by Weston Brass PharmD.         Prior Anticoagulation Instructions: INR 4.6  Called spoke with pt's daughter Randa Evens, advised to have pt hold 2 doses of Coumadin, then resume same dosage 5mg  daily.  Recheck in 1 week.  Orders faxed to [facility].   Current Anticoagulation Instructions: INR 1.7 LMOM for daughter at both #'s.   Spoke with pt's daughter.  Take 7mg  today then resume same  dose of 5mg  daily.  Recheck INR in 1 week.  Orders faxed to [facility].  Weston Brass PharmD  February 28, 2010 1:49 PM  Prescriptions: WARFARIN SODIUM 5 MG TABS (WARFARIN SODIUM) take one daily as directed by coumadin clinic  #35 x 3   Entered by:   Weston Brass PharmD   Authorized by:   [name], MD   Signed by:   Weston Brass PharmD on 02/28/2010   Method used:   Electronically to        [facility]. [phone]* (retail)       [phone]       [place], [place]  [address]       Ph: [phone]       Fax: [phone]   RxID:   [id number]

## 2010-03-14 ENCOUNTER — Encounter: Payer: Self-pay | Admitting: Internal Medicine

## 2010-03-16 NOTE — Medication Information (Signed)
Summary: Coumadin Clinic  Anticoagulant Therapy  Managed by: Cloyde Reams, RN, BSN Referring MD: Dr [name] PCP: Royanne Foots Supervising MD: Jens Som MD, Arlys John Indication 1: Atrial Fibrillation Lab Used: [facility]  [place] Site: [address] PT 25.8 INR POC 2.1 INR RANGE 2.0-3.0    Bleeding/hemorrhagic complications: no     Any changes in medication regimen? no     Any missed doses?: no       Is patient compliant with meds? yes       Allergies: 1)  ! Pcn 2)  ! Demerol 3)  ! Erythromycin 4)  ! Cleocin  Anticoagulation Management History:      Her anticoagulation is being managed by telephone today.  Positive risk factors for bleeding include an age of 60 years or older.  The bleeding index is 'intermediate risk'.  Positive CHADS2 values include History of CHF, History of HTN, and Age > 34 years old.  Her last INR was 1.2 ratio.  Prothrombin time is 25.8.  Anticoagulation responsible provider: Jens Som MD, Arlys John.  INR POC: 2.1.  Exp: 02/2011.    Anticoagulation Management Assessment/Plan:      The patient's current anticoagulation dose is Warfarin sodium 5 mg tabs: take one daily as directed by coumadin clinic, Warfarin sodium 2 mg tabs: Use as directed by Anticoagualtion Clinic.  The target INR is 2.0-3.0.  The next INR is due 03/14/2010.  Anticoagulation instructions were given to patient/son .  Results were reviewed/authorized by Cloyde Reams, RN, BSN.  She was notified by Cloyde Reams RN.         Prior Anticoagulation Instructions: INR 1.7 LMOM for daughter at both #'s.   Spoke with pt's daughter.  Take 7mg  today then resume same dose of 5mg  daily.  Recheck INR in 1 week.  Orders faxed to [facility].  Weston Brass PharmD  February 28, 2010 1:49 PM   Current Anticoagulation Instructions: INR 2.1  Called spoke with pt's daughter Randa Evens, advised to have pt continue on same dosage 5mg  daily.  Recheck in 1 week.  Orders faxed back to [facility].

## 2010-03-16 NOTE — Medication Information (Signed)
Summary: Physician's Orders   Physician's Orders   Imported By: Roderic Ovens 03/07/2010 11:39:12  _____________________________________________________________________  External Attachment:    Type:   Image     Comment:   External Document

## 2010-03-16 NOTE — Medication Information (Signed)
Summary: Physician's Orders   Physician's Orders   Imported By: Roderic Ovens 03/10/2010 15:54:37  _____________________________________________________________________  External Attachment:    Type:   Image     Comment:   External Document

## 2010-03-22 NOTE — Medication Information (Signed)
Summary: Coumadin Clinic  Anticoagulant Therapy  Managed by: Cloyde Reams, RN, BSN Referring MD: Dr [name] PCP: Royanne Foots Supervising MD: Tenny Craw MD, Gunnar Fusi Indication 1: Atrial Fibrillation Lab Used: [facility]  [place] Site: [address] PT 26.0 INR POC 2.2 INR RANGE 2.0-3.0  Dietary changes: no    Health status changes: no    Bleeding/hemorrhagic complications: no    Recent/future hospitalizations: no    Any changes in medication regimen? no    Recent/future dental: no  Any missed doses?: no       Is patient compliant with meds? yes       Allergies: 1)  ! Pcn 2)  ! Demerol 3)  ! Erythromycin 4)  ! Cleocin  Anticoagulation Management History:      Her anticoagulation is being managed by telephone today.  Positive risk factors for bleeding include an age of 69 years or older.  The bleeding index is 'intermediate risk'.  Positive CHADS2 values include History of CHF, History of HTN, and Age > 42 years old.  Her last INR was 1.2 ratio.  Prothrombin time is 26.0.  Anticoagulation responsible provider: Tenny Craw MD, Gunnar Fusi.  INR POC: 2.2.  Exp: 02/2011.    Anticoagulation Management Assessment/Plan:      The patient's current anticoagulation dose is Warfarin sodium 5 mg tabs: take one daily as directed by coumadin clinic, Warfarin sodium 2 mg tabs: Use as directed by Anticoagualtion Clinic.  The target INR is 2.0-3.0.  The next INR is due 03/28/2010.  Anticoagulation instructions were given to patient/son .  Results were reviewed/authorized by Cloyde Reams, RN, BSN.  She was notified by Cloyde Reams RN.         Prior Anticoagulation Instructions: INR 2.1  Called spoke with pt's daughter Randa Evens, advised to have pt continue on same dosage 5mg  daily.  Recheck in 1 week.  Orders faxed back to [facility].   Current Anticoagulation Instructions: INR 2.2  Called spoke with pt's daughter, advised to have pt continue on same dosage 5mg  daily.  Recheck in 2 weeks. Orders faxed  back to [facility] to continue on same dosage and recheck on 03/28/10.

## 2010-03-22 NOTE — Medication Information (Signed)
Summary: Med Orders  Med Orders   Imported By: Marylou Mccoy 03/18/2010 13:37:54  _____________________________________________________________________  External Attachment:    Type:   Image     Comment:   External Document

## 2010-03-22 NOTE — Medication Information (Signed)
Summary: Lab Orders  Lab Orders   Imported By: Marylou Mccoy 03/14/2010 14:10:36  _____________________________________________________________________  External Attachment:    Type:   Image     Comment:   External Document

## 2010-03-22 NOTE — Medication Information (Signed)
Summary: Lab Orders  Lab Orders   Imported By: Marylou Mccoy 03/16/2010 14:49:04  _____________________________________________________________________  External Attachment:    Type:   Image     Comment:   External Document

## 2010-03-22 NOTE — Medication Information (Signed)
Summary: Med Orders  Med Orders   Imported By: Marylou Mccoy 03/18/2010 09:37:16  _____________________________________________________________________  External Attachment:    Type:   Image     Comment:   External Document

## 2010-03-28 ENCOUNTER — Encounter: Payer: Self-pay | Admitting: Internal Medicine

## 2010-03-28 ENCOUNTER — Encounter: Payer: Self-pay | Admitting: Cardiology

## 2010-03-28 LAB — CONVERTED CEMR LAB: POC INR: 2.4

## 2010-03-31 ENCOUNTER — Encounter: Payer: Self-pay | Admitting: Internal Medicine

## 2010-03-31 DIAGNOSIS — I059 Rheumatic mitral valve disease, unspecified: Secondary | ICD-10-CM

## 2010-03-31 DIAGNOSIS — I4891 Unspecified atrial fibrillation: Secondary | ICD-10-CM

## 2010-04-05 NOTE — Medication Information (Signed)
Summary: Coumadin Clinic  Anticoagulant Therapy  Managed by: Cloyde Reams, RN, BSN Referring MD: Dr [name] PCP: Royanne Foots Supervising MD: Daleen Squibb MD, Maisie Fus Indication 1: Atrial Fibrillation Lab Used: [facility]  [place] Site: [address] PT 28.6 INR POC 2.4 INR RANGE 2.0-3.0           Allergies: 1)  ! Pcn 2)  ! Demerol 3)  ! Erythromycin 4)  ! Cleocin  Anticoagulation Management History:      Her anticoagulation is being managed by telephone today.  Positive risk factors for bleeding include an age of 18 years or older.  The bleeding index is 'intermediate risk'.  Positive CHADS2 values include History of CHF, History of HTN, and Age > 24 years old.  Her last INR was 1.2 ratio.  Prothrombin time is 28.6.  Anticoagulation responsible provider: Daleen Squibb MD, Maisie Fus.  INR POC: 2.4.  Exp: 02/2011.    Anticoagulation Management Assessment/Plan:      The patient's current anticoagulation dose is Warfarin sodium 5 mg tabs: take one daily as directed by coumadin clinic, Warfarin sodium 2 mg tabs: Use as directed by Anticoagualtion Clinic.  The target INR is 2.0-3.0.  The next INR is due 04/18/2010.  Anticoagulation instructions were given to patient/son .  Results were reviewed/authorized by Cloyde Reams, RN, BSN.  She was notified by Cloyde Reams RN.         Prior Anticoagulation Instructions: INR 2.2  Called spoke with pt's daughter, advised to have pt continue on same dosage 5mg  daily.  Recheck in 2 weeks. Orders faxed back to [facility] to continue on same dosage and recheck on 03/28/10.   Current Anticoagulation Instructions: INR 2.4  Continue on same dosage 5mg  daily.  Recheck in 3 weeks.  Orders faxed back to [facility].  Attempted to call pt's daughter with dosage instructions.  LMOM TCB. Cloyde Reams RN  March 28, 2010 1:58 PM  Spoke with Randa Evens pt's daughter.  Advised of dosage instructions and redraw date.  Cloyde Reams RN  March 28, 2010 2:38 PM

## 2010-04-06 LAB — BASIC METABOLIC PANEL
BUN: 15 mg/dL (ref 6–23)
BUN: 16 mg/dL (ref 6–23)
BUN: 5 mg/dL — ABNORMAL LOW (ref 6–23)
BUN: 6 mg/dL (ref 6–23)
BUN: 7 mg/dL (ref 6–23)
CO2: 28 mEq/L (ref 19–32)
CO2: 28 mEq/L (ref 19–32)
CO2: 30 mEq/L (ref 19–32)
CO2: 30 mEq/L (ref 19–32)
Calcium: 8.5 mg/dL (ref 8.4–10.5)
Calcium: 8.6 mg/dL (ref 8.4–10.5)
Calcium: 8.6 mg/dL (ref 8.4–10.5)
Calcium: 8.8 mg/dL (ref 8.4–10.5)
Calcium: 9 mg/dL (ref 8.4–10.5)
Chloride: 106 mEq/L (ref 96–112)
Chloride: 108 mEq/L (ref 96–112)
Chloride: 108 mEq/L (ref 96–112)
Chloride: 111 mEq/L (ref 96–112)
Creatinine, Ser: 0.56 mg/dL (ref 0.4–1.2)
Creatinine, Ser: 0.58 mg/dL (ref 0.4–1.2)
Creatinine, Ser: 0.59 mg/dL (ref 0.4–1.2)
Creatinine, Ser: 0.59 mg/dL (ref 0.4–1.2)
Creatinine, Ser: 0.59 mg/dL (ref 0.4–1.2)
GFR calc Af Amer: >60 mL/min (ref >60)
GFR calc Af Amer: >60 mL/min (ref >60)
GFR calc Af Amer: >60 mL/min (ref >60)
GFR calc Af Amer: >60 mL/min (ref >60)
GFR calc non Af Amer: >60 mL/min (ref >60)
GFR calc non Af Amer: >60 mL/min (ref >60)
GFR calc non Af Amer: >60 mL/min (ref >60)
GFR calc non Af Amer: >60 mL/min (ref >60)
Glucose, Bld: 100 mg/dL — ABNORMAL HIGH (ref 70–99)
Glucose, Bld: 103 mg/dL — ABNORMAL HIGH (ref 70–99)
Glucose, Bld: 113 mg/dL — ABNORMAL HIGH (ref 70–99)
Glucose, Bld: 96 mg/dL (ref 70–99)
Glucose, Bld: 97 mg/dL (ref 70–99)
Potassium: 2.7 mEq/L — CL (ref 3.5–5.1)
Potassium: 3.6 mEq/L (ref 3.5–5.1)
Potassium: 3.8 mEq/L (ref 3.5–5.1)
Potassium: 3.8 mEq/L (ref 3.5–5.1)
Sodium: 141 mEq/L (ref 135–145)
Sodium: 142 mEq/L (ref 135–145)
Sodium: 142 mEq/L (ref 135–145)
Sodium: 142 mEq/L (ref 135–145)
Sodium: 143 mEq/L (ref 135–145)

## 2010-04-06 LAB — PROTEIN AND GLUCOSE, CSF
Glucose, CSF: 72 mg/dL (ref 43–76)
Total  Protein, CSF: 206 mg/dL — ABNORMAL HIGH (ref 15–45)

## 2010-04-06 LAB — CBC
HCT: 37.4 % (ref 36.0–46.0)
HCT: 38.9 % (ref 36.0–46.0)
HCT: 40.9 % (ref 36.0–46.0)
Hemoglobin: 12.1 g/dL (ref 12.0–15.0)
Hemoglobin: 12.6 g/dL (ref 12.0–15.0)
MCH: 30.6 pg (ref 26.0–34.0)
MCH: 31.4 pg (ref 26.0–34.0)
MCHC: 32.4 g/dL (ref 30.0–36.0)
MCHC: 32.4 g/dL (ref 30.0–36.0)
MCV: 94.4 fL (ref 78.0–100.0)
MCV: 95.6 fL (ref 78.0–100.0)
MCV: 97 fL (ref 78.0–100.0)
Platelets: 146 10*3/uL — ABNORMAL LOW (ref 150–400)
Platelets: 170 10*3/uL (ref 150–400)
Platelets: 210 10*3/uL (ref 150–400)
RBC: 3.96 MIL/uL (ref 3.87–5.11)
RBC: 4.01 MIL/uL (ref 3.87–5.11)
RBC: 4.14 MIL/uL (ref 3.87–5.11)
RBC: 4.28 MIL/uL (ref 3.87–5.11)
RDW: 13.4 % (ref 11.5–15.5)
RDW: 13.7 % (ref 11.5–15.5)
RDW: 14.1 % (ref 11.5–15.5)
WBC: 4 10*3/uL (ref 4.0–10.5)
WBC: 4.4 10*3/uL (ref 4.0–10.5)
WBC: 4.7 10*3/uL (ref 4.0–10.5)
WBC: 5.3 10*3/uL (ref 4.0–10.5)

## 2010-04-06 LAB — PROTIME-INR
INR: 1.17 (ref 0.00–1.49)
INR: 1.4 (ref 0.00–1.49)
INR: 1.45 (ref 0.00–1.49)
INR: 1.57 — ABNORMAL HIGH (ref 0.00–1.49)
INR: 1.61 — ABNORMAL HIGH (ref 0.00–1.49)
INR: 2.08 — ABNORMAL HIGH (ref 0.00–1.49)
INR: 2.33 — ABNORMAL HIGH (ref 0.00–1.49)
Prothrombin Time: 16.1 seconds — ABNORMAL HIGH (ref 11.6–15.2)
Prothrombin Time: 17.4 seconds — ABNORMAL HIGH (ref 11.6–15.2)
Prothrombin Time: 17.8 seconds — ABNORMAL HIGH (ref 11.6–15.2)
Prothrombin Time: 19 seconds — ABNORMAL HIGH (ref 11.6–15.2)
Prothrombin Time: 19.3 seconds — ABNORMAL HIGH (ref 11.6–15.2)
Prothrombin Time: 24.8 seconds — ABNORMAL HIGH (ref 11.6–15.2)
Prothrombin Time: 25.7 seconds — ABNORMAL HIGH (ref 11.6–15.2)

## 2010-04-06 LAB — CSF CELL COUNT WITH DIFFERENTIAL
RBC Count, CSF: 115000 /mm3 — ABNORMAL HIGH
Tube #: 3
WBC, CSF: 11 /mm3 (ref 0–5)

## 2010-04-06 LAB — CSF CULTURE W GRAM STAIN: Culture: NO GROWTH

## 2010-04-07 LAB — URINE MICROSCOPIC-ADD ON

## 2010-04-07 LAB — URINE CULTURE
Colony Count: 65000
Culture  Setup Time: 201110121000

## 2010-04-07 LAB — BENZODIAZEPINE, QUANTITATIVE, URINE
Alprazolam (GC/LC/MS), ur confirm: NEGATIVE NG/ML
Nordiazepam GC/MS Conf: NEGATIVE NG/ML
Oxazepam GC/MS Conf: 498 NG/ML — ABNORMAL HIGH

## 2010-04-07 LAB — BASIC METABOLIC PANEL
CO2: 25 mEq/L (ref 19–32)
Calcium: 8.9 mg/dL (ref 8.4–10.5)
Chloride: 112 mEq/L (ref 96–112)
Creatinine, Ser: 0.55 mg/dL (ref 0.4–1.2)
Creatinine, Ser: 0.79 mg/dL (ref 0.4–1.2)
GFR calc Af Amer: >60 mL/min (ref >60)
GFR calc Af Amer: >60 mL/min (ref >60)
GFR calc Af Amer: >60 mL/min (ref >60)
GFR calc non Af Amer: >60 mL/min (ref >60)
GFR calc non Af Amer: >60 mL/min (ref >60)
Glucose, Bld: 74 mg/dL (ref 70–99)
Potassium: 3.3 mEq/L — ABNORMAL LOW (ref 3.5–5.1)
Potassium: 4.2 mEq/L (ref 3.5–5.1)
Sodium: 144 mEq/L (ref 135–145)

## 2010-04-07 LAB — BRAIN NATRIURETIC PEPTIDE: Pro B Natriuretic peptide (BNP): 126 pg/mL — ABNORMAL HIGH (ref 0.0–100.0)

## 2010-04-07 LAB — PROTIME-INR
INR: 4.33 — ABNORMAL HIGH (ref 0.00–1.49)
INR: 5.26 (ref 0.00–1.49)
Prothrombin Time: 41.4 seconds — ABNORMAL HIGH (ref 11.6–15.2)
Prothrombin Time: 43 seconds — ABNORMAL HIGH (ref 11.6–15.2)
Prothrombin Time: 48.1 seconds — ABNORMAL HIGH (ref 11.6–15.2)

## 2010-04-07 LAB — URINALYSIS, ROUTINE W REFLEX MICROSCOPIC
Bilirubin Urine: NEGATIVE
Glucose, UA: NEGATIVE mg/dL
Protein, ur: NEGATIVE mg/dL
Urobilinogen, UA: 1 mg/dL (ref 0.0–1.0)

## 2010-04-07 LAB — OPIATE, QUANTITATIVE, URINE
Codeine Urine: NEGATIVE NG/ML
Morphine, Confirm: 18814 NG/ML — ABNORMAL HIGH
Oxycodone, ur: NEGATIVE NG/ML
Oxymorphone: NEGATIVE NG/ML

## 2010-04-07 LAB — TSH: TSH: 0.407 u[IU]/mL (ref 0.350–4.500)

## 2010-04-07 LAB — DIFFERENTIAL
Basophils Absolute: 0 10*3/uL (ref 0.0–0.1)
Basophils Absolute: 0 10*3/uL (ref 0.0–0.1)
Basophils Relative: 0 % (ref 0–1)
Basophils Relative: 0 % (ref 0–1)
Lymphocytes Relative: 18 % (ref 12–46)
Lymphocytes Relative: 25 % (ref 12–46)
Monocytes Absolute: 0.5 10*3/uL (ref 0.1–1.0)
Neutro Abs: 3.2 10*3/uL (ref 1.7–7.7)
Neutro Abs: 3.8 10*3/uL (ref 1.7–7.7)
Neutrophils Relative %: 72 % (ref 43–77)

## 2010-04-07 LAB — POCT CARDIAC MARKERS
CKMB, poc: 4.2 ng/mL (ref 1.0–8.0)
Myoglobin, poc: 98.9 ng/mL (ref 12–200)
Troponin i, poc: <0.05 ng/mL (ref 0.00–0.09)

## 2010-04-07 LAB — MAGNESIUM
Magnesium: 2.2 mg/dL (ref 1.5–2.5)
Magnesium: 2.3 mg/dL (ref 1.5–2.5)

## 2010-04-07 LAB — CBC
HCT: 41.1 % (ref 36.0–46.0)
MCH: 31.3 pg (ref 26.0–34.0)
MCHC: 32.4 g/dL (ref 30.0–36.0)
MCV: 95.4 fL (ref 78.0–100.0)
Platelets: 141 10*3/uL — ABNORMAL LOW (ref 150–400)
Platelets: 146 10*3/uL — ABNORMAL LOW (ref 150–400)
RBC: 4.31 MIL/uL (ref 3.87–5.11)
RDW: 13.9 % (ref 11.5–15.5)
WBC: 5.1 10*3/uL (ref 4.0–10.5)

## 2010-04-07 LAB — URINE DRUGS OF ABUSE SCREEN W ALC, ROUTINE (REF LAB)
Amphetamine Screen, Ur: NEGATIVE
Barbiturate Quant, Ur: NEGATIVE
Cocaine Metabolites: NEGATIVE
Creatinine,U: 101.2 mg/dL
Ethyl Alcohol: <10 mg/dL (ref <10)
Opiate Screen, Urine: POSITIVE — AB

## 2010-04-07 LAB — COMPREHENSIVE METABOLIC PANEL
Albumin: 3.9 g/dL (ref 3.5–5.2)
Alkaline Phosphatase: 69 U/L (ref 39–117)
BUN: 9 mg/dL (ref 6–23)
Chloride: 110 mEq/L (ref 96–112)
Creatinine, Ser: 0.63 mg/dL (ref 0.4–1.2)
GFR calc non Af Amer: >60 mL/min (ref >60)
Glucose, Bld: 89 mg/dL (ref 70–99)
Potassium: 3.7 mEq/L (ref 3.5–5.1)
Total Bilirubin: 0.9 mg/dL (ref 0.3–1.2)

## 2010-04-07 LAB — CK: Total CK: 81 U/L (ref 7–177)

## 2010-04-07 LAB — T4, FREE: Free T4: 1.54 ng/dL (ref 0.80–1.80)

## 2010-04-07 LAB — AMMONIA: Ammonia: 24 umol/L (ref 11–35)

## 2010-04-07 LAB — VITAMIN B12: Vitamin B-12: 213 pg/mL (ref 211–911)

## 2010-04-07 LAB — APTT: aPTT: 42 seconds — ABNORMAL HIGH (ref 24–37)

## 2010-04-07 LAB — PHOSPHORUS: Phosphorus: 3.6 mg/dL (ref 2.3–4.6)

## 2010-04-07 LAB — SEDIMENTATION RATE: Sed Rate: 6 mm/hr (ref 0–22)

## 2010-04-07 LAB — METHYLMALONIC ACID, SERUM: Methylmalonic Acid, Quantitative: 333 nmol/L — ABNORMAL HIGH (ref 87–318)

## 2010-04-11 ENCOUNTER — Encounter: Payer: Self-pay | Admitting: Internal Medicine

## 2010-04-18 ENCOUNTER — Ambulatory Visit (INDEPENDENT_AMBULATORY_CARE_PROVIDER_SITE_OTHER): Payer: Medicare Other | Admitting: Cardiovascular Disease

## 2010-04-18 DIAGNOSIS — I059 Rheumatic mitral valve disease, unspecified: Secondary | ICD-10-CM

## 2010-04-18 DIAGNOSIS — I4891 Unspecified atrial fibrillation: Secondary | ICD-10-CM

## 2010-04-18 LAB — PROTIME-INR
INR: 2.3 — AB (ref <=1.1)
INR: 3.14 — ABNORMAL HIGH (ref 0.00–1.49)
Prothrombin Time: 32 seconds — ABNORMAL HIGH (ref 11.6–15.2)

## 2010-04-18 NOTE — Patient Instructions (Addendum)
INR 2.3 drawn at [facility].  Patient to continue taking 5 mg daily.  Recheck in 3 weeks. Orders sent to [facility].

## 2010-04-21 ENCOUNTER — Other Ambulatory Visit (HOSPITAL_COMMUNITY): Payer: Self-pay | Admitting: Radiology

## 2010-04-21 DIAGNOSIS — I4891 Unspecified atrial fibrillation: Secondary | ICD-10-CM

## 2010-04-21 NOTE — Letter (Signed)
Summary: [facility] at [facility] at [facility]   Imported By: Marylou Mccoy 04/11/2010 15:02:59  _____________________________________________________________________  External Attachment:    Type:   Image     Comment:   External Document

## 2010-04-22 ENCOUNTER — Ambulatory Visit (HOSPITAL_COMMUNITY): Payer: Medicare Other | Attending: Internal Medicine | Admitting: Radiology

## 2010-04-22 ENCOUNTER — Encounter: Payer: Self-pay | Admitting: Internal Medicine

## 2010-04-22 ENCOUNTER — Ambulatory Visit: Payer: Self-pay | Admitting: Internal Medicine

## 2010-04-22 ENCOUNTER — Ambulatory Visit (INDEPENDENT_AMBULATORY_CARE_PROVIDER_SITE_OTHER): Payer: Medicare Other | Admitting: Internal Medicine

## 2010-04-22 DIAGNOSIS — I509 Heart failure, unspecified: Secondary | ICD-10-CM | POA: Insufficient documentation

## 2010-04-22 DIAGNOSIS — I059 Rheumatic mitral valve disease, unspecified: Secondary | ICD-10-CM | POA: Insufficient documentation

## 2010-04-22 DIAGNOSIS — I10 Essential (primary) hypertension: Secondary | ICD-10-CM | POA: Insufficient documentation

## 2010-04-22 DIAGNOSIS — I4891 Unspecified atrial fibrillation: Secondary | ICD-10-CM | POA: Insufficient documentation

## 2010-04-22 DIAGNOSIS — I5032 Chronic diastolic (congestive) heart failure: Secondary | ICD-10-CM

## 2010-04-22 NOTE — Assessment & Plan Note (Signed)
At goal.  No changes today.  

## 2010-04-22 NOTE — Assessment & Plan Note (Signed)
Maintaining sinus rhythm Continue coumadin (goal INR 2-3) Continue amiodarone 100mg  daily Recent TFTs were normal.  She will need LFTS and CXR by Dr Virginia Rochester for routine surveillance.

## 2010-04-22 NOTE — Assessment & Plan Note (Signed)
Much improved Continue current medical therapy

## 2010-04-22 NOTE — Progress Notes (Signed)
The patient presents today for routine electrophysiology followup.  Since last being seen in our clinic, she reports doing very well.  Today, she denies symptoms of palpitations, chest pain, shortness of breath, orthopnea, PND, lower extremity edema, dizziness, presyncope, syncope, or neurologic sequela.  The patient feels that she is tolerating medications without difficulties and is otherwise without complaint today.   Past Medical History  Diagnosis Date  . Persistent atrial fibrillation   . Sinus bradycardia   . Edema   . DDD (degenerative disc disease)   . HTN (hypertension)   . Depression, reactive, psychotic   . Mitral regurgitation    Past Surgical History  Procedure Date  . Laminectomy   . Cesarean section     Current outpatient prescriptions:ALPRAZolam (XANAX) 0.25 MG tablet, Take 0.25 mg by mouth 2 (two) times daily. , Disp: , Rfl: ;  amiodarone (PACERONE) 100 MG tablet, Take 100 mg by mouth daily. , Disp: , Rfl: ;  HYDROmorphone HCl (EXALGO) 16 MG TB24, Take by mouth daily.  , Disp: , Rfl: ;  lisinopril (PRINIVIL,ZESTRIL) 10 MG tablet, Take 10 mg by mouth daily. , Disp: , Rfl:  Methylfol-Methylcob-Acetylcyst (CEREFOLIN NAC) 6-2-600 MG TABS, Take by mouth daily. , Disp: , Rfl: ;  OLANZapine (ZYPREXA) 10 MG tablet, Take 10 mg by mouth. Take 1 tab daily, Disp: , Rfl: ;  polyethylene glycol (MIRALAX / GLYCOLAX) packet, Take 17 g by mouth as needed.  , Disp: , Rfl: ;  traZODone (DESYREL) 50 MG tablet, Take 50 mg by mouth at bedtime.  , Disp: , Rfl:  venlafaxine (EFFEXOR-XR) 75 MG 24 hr capsule, Take 75 mg by mouth daily.  , Disp: , Rfl: ;  warfarin (COUMADIN) 5 MG tablet, Take by mouth as directed. , Disp: , Rfl: ;  DISCONTD: Methylfol-Methylcob-Acetylcyst (CEREFOLIN NAC) 6-2-600 MG TABS, Take by mouth.  , Disp: , Rfl: ;  DISCONTD: morphine (MS CONTIN) 30 MG 12 hr tablet, 60 mg. ONE TAB Q SIX HOURS , Disp: , Rfl:  DISCONTD: Morphine Sulfate Beads (AVINZA) 45 MG CP24, Take by mouth. 1 TAB  THREE TIMES A DAY , Disp: , Rfl:   Allergies  Allergen Reactions  . Erythromycin   . Meperidine Hcl   . Penicillins     History   Social History  . Marital Status: Married    Spouse Name: N/A    Number of Children: N/A  . Years of Education: N/A   Occupational History  . Not on file.   Social History Main Topics  . Smoking status: Former Games developer  . Smokeless tobacco: Not on file  . Alcohol Use: Yes  . Drug Use: No  . Sexually Active: Not on file   Other Topics Concern  . Not on file   Social History Narrative  . No narrative on file    No family history on file.  Physical Exam: Filed Vitals:   04/22/10 1600  BP: 110/70  Pulse: 68  Height: 5\' 2"  (1.575 m)  Weight: 158 lb (71.668 kg)    GEN- The patient is well appearing, alert and oriented x 3 today.   Head- normocephalic, atraumatic Eyes-  Sclera clear, conjunctiva pink Ears- hearing intact Oropharynx- clear Neck- supple, no JVP Lymph- no cervical lymphadenopathy Lungs- Clear to ausculation bilaterally, normal work of breathing Heart- Regular rate and rhythm, 2/6 SEM LUSB GI- soft, NT, ND, + BS Extremities- no clubbing, cyanosis, or edema MS- walks slowly with a walker Skin- no rash or lesion Psych-  euthymic mood, full affect Neuro- strength and sensation are intact

## 2010-04-22 NOTE — Assessment & Plan Note (Signed)
I have reviewed the echo from today in detail with the patient and her family. Her MR has improved.  Biatrial enlargement has also significantly improved.

## 2010-04-22 NOTE — Patient Instructions (Signed)
Your physician wants you to follow-up in: 6 months with Dr Allred You will receive a reminder letter in the mail two months in advance. If you don't receive a letter, please call our office to schedule the follow-up appointment.  Your physician recommends that you continue on your current medications as directed. Please refer to the Current Medication list given to you today.   

## 2010-05-09 ENCOUNTER — Ambulatory Visit: Payer: Self-pay | Admitting: Cardiology

## 2010-06-06 ENCOUNTER — Ambulatory Visit: Payer: Self-pay | Admitting: Cardiology

## 2010-06-06 LAB — POCT INR: INR: 2.8

## 2010-06-30 ENCOUNTER — Ambulatory Visit (INDEPENDENT_AMBULATORY_CARE_PROVIDER_SITE_OTHER): Payer: Medicare Other | Admitting: Licensed Clinical Social Worker

## 2010-06-30 DIAGNOSIS — F4323 Adjustment disorder with mixed anxiety and depressed mood: Secondary | ICD-10-CM

## 2010-07-04 ENCOUNTER — Telehealth: Payer: Self-pay | Admitting: Pharmacist

## 2010-07-04 ENCOUNTER — Ambulatory Visit: Payer: Self-pay | Admitting: Cardiology

## 2010-07-04 MED ORDER — WARFARIN SODIUM 5 MG PO TABS: 5.0000 mg | ORAL_TABLET | ORAL | Status: DC

## 2010-07-04 NOTE — Telephone Encounter (Signed)
Refill sent to [facility] on [address]

## 2010-07-07 ENCOUNTER — Ambulatory Visit (INDEPENDENT_AMBULATORY_CARE_PROVIDER_SITE_OTHER): Payer: Medicare Other | Admitting: Licensed Clinical Social Worker

## 2010-07-07 DIAGNOSIS — F4323 Adjustment disorder with mixed anxiety and depressed mood: Secondary | ICD-10-CM

## 2010-07-14 ENCOUNTER — Ambulatory Visit (INDEPENDENT_AMBULATORY_CARE_PROVIDER_SITE_OTHER): Payer: Medicare Other | Admitting: Licensed Clinical Social Worker

## 2010-07-14 DIAGNOSIS — F4323 Adjustment disorder with mixed anxiety and depressed mood: Secondary | ICD-10-CM

## 2010-07-25 ENCOUNTER — Ambulatory Visit (INDEPENDENT_AMBULATORY_CARE_PROVIDER_SITE_OTHER): Payer: Self-pay | Admitting: Cardiology

## 2010-07-25 DIAGNOSIS — R0989 Other specified symptoms and signs involving the circulatory and respiratory systems: Secondary | ICD-10-CM

## 2010-07-30 ENCOUNTER — Other Ambulatory Visit: Payer: Self-pay | Admitting: Internal Medicine

## 2010-08-04 ENCOUNTER — Ambulatory Visit (INDEPENDENT_AMBULATORY_CARE_PROVIDER_SITE_OTHER): Payer: Medicare Other | Admitting: Licensed Clinical Social Worker

## 2010-08-04 DIAGNOSIS — F4323 Adjustment disorder with mixed anxiety and depressed mood: Secondary | ICD-10-CM

## 2010-08-05 ENCOUNTER — Ambulatory Visit (INDEPENDENT_AMBULATORY_CARE_PROVIDER_SITE_OTHER): Payer: Self-pay | Admitting: Cardiology

## 2010-08-05 DIAGNOSIS — R0989 Other specified symptoms and signs involving the circulatory and respiratory systems: Secondary | ICD-10-CM

## 2010-08-05 LAB — POCT INR: INR: 2.1

## 2010-08-25 ENCOUNTER — Ambulatory Visit (INDEPENDENT_AMBULATORY_CARE_PROVIDER_SITE_OTHER): Payer: Medicare Other | Admitting: Licensed Clinical Social Worker

## 2010-08-25 DIAGNOSIS — F4323 Adjustment disorder with mixed anxiety and depressed mood: Secondary | ICD-10-CM

## 2010-08-26 ENCOUNTER — Ambulatory Visit (INDEPENDENT_AMBULATORY_CARE_PROVIDER_SITE_OTHER): Payer: Self-pay | Admitting: Cardiology

## 2010-08-26 LAB — POCT INR: INR: 2.3

## 2010-09-08 ENCOUNTER — Ambulatory Visit (INDEPENDENT_AMBULATORY_CARE_PROVIDER_SITE_OTHER): Payer: Medicare Other | Admitting: Licensed Clinical Social Worker

## 2010-09-08 DIAGNOSIS — F4323 Adjustment disorder with mixed anxiety and depressed mood: Secondary | ICD-10-CM

## 2010-09-16 ENCOUNTER — Ambulatory Visit (INDEPENDENT_AMBULATORY_CARE_PROVIDER_SITE_OTHER): Payer: Medicare Other | Admitting: Internal Medicine

## 2010-09-16 DIAGNOSIS — I4891 Unspecified atrial fibrillation: Secondary | ICD-10-CM

## 2010-09-16 LAB — POCT INR: INR: 2.7

## 2010-10-12 ENCOUNTER — Ambulatory Visit (INDEPENDENT_AMBULATORY_CARE_PROVIDER_SITE_OTHER): Payer: Medicare Other | Admitting: Licensed Clinical Social Worker

## 2010-10-12 DIAGNOSIS — F4323 Adjustment disorder with mixed anxiety and depressed mood: Secondary | ICD-10-CM

## 2010-10-14 ENCOUNTER — Ambulatory Visit (INDEPENDENT_AMBULATORY_CARE_PROVIDER_SITE_OTHER): Payer: Self-pay | Admitting: Cardiology

## 2010-10-14 DIAGNOSIS — R0989 Other specified symptoms and signs involving the circulatory and respiratory systems: Secondary | ICD-10-CM

## 2010-10-14 LAB — POCT INR: INR: 4.2

## 2010-10-21 ENCOUNTER — Encounter: Payer: Self-pay | Admitting: Internal Medicine

## 2010-10-24 ENCOUNTER — Encounter: Payer: Self-pay | Admitting: Internal Medicine

## 2010-10-24 ENCOUNTER — Ambulatory Visit (INDEPENDENT_AMBULATORY_CARE_PROVIDER_SITE_OTHER): Payer: Medicare Other | Admitting: Internal Medicine

## 2010-10-24 DIAGNOSIS — I10 Essential (primary) hypertension: Secondary | ICD-10-CM

## 2010-10-24 DIAGNOSIS — I509 Heart failure, unspecified: Secondary | ICD-10-CM

## 2010-10-24 DIAGNOSIS — I4891 Unspecified atrial fibrillation: Secondary | ICD-10-CM

## 2010-10-24 DIAGNOSIS — I5032 Chronic diastolic (congestive) heart failure: Secondary | ICD-10-CM

## 2010-10-24 NOTE — Progress Notes (Signed)
The patient presents today for routine electrophysiology followup.  Since last being seen in our clinic, she reports doing reasonably well.  She reports occasional fatigue and SOB.  She had 30 minutes of right sided chest pain on Sunday at rest, none since.  Today, she denies symptoms of palpitations, exertional chest pain,  orthopnea, PND, lower extremity edema, dizziness, presyncope, syncope, or neurologic sequela.  The patient feels that she is tolerating medications without difficulties and is otherwise without complaint today.   Past Medical History  Diagnosis Date  . Persistent atrial fibrillation   . Sinus bradycardia   . Edema   . DDD (degenerative disc disease)   . HTN (hypertension)   . Depression, reactive, psychotic   . Mitral regurgitation    Past Surgical History  Procedure Date  . Laminectomy   . Cesarean section     Current outpatient prescriptions:ALPRAZolam (XANAX) 0.25 MG tablet, Take 0.25 mg by mouth 2 (two) times daily. , Disp: , Rfl: ;  amiodarone (PACERONE) 100 MG tablet, Take 100 mg by mouth daily. , Disp: , Rfl: ;  HYDROmorphone HCl (EXALGO) 16 MG TB24, Take 1 tablet by mouth daily.  , Disp: , Rfl: ;  lisinopril (PRINIVIL,ZESTRIL) 10 MG tablet, take 1 tablet by mouth once daily, Disp: 30 tablet, Rfl: 11 Methylfol-Methylcob-Acetylcyst (CEREFOLIN NAC) 6-2-600 MG TABS, Take 1 tablet by mouth 2 (two) times daily. , Disp: , Rfl: ;  polyethylene glycol (MIRALAX / GLYCOLAX) packet, Take 17 g by mouth as needed.  , Disp: , Rfl: ;  senna (SENOKOT) 8.6 MG tablet, Take 1 tablet by mouth at bedtime.  , Disp: , Rfl: ;  traZODone (DESYREL) 50 MG tablet, Take 50 mg by mouth at bedtime.  , Disp: , Rfl:  venlafaxine (EFFEXOR-XR) 75 MG 24 hr capsule, Take 75 mg by mouth daily.  , Disp: , Rfl: ;  warfarin (COUMADIN) 5 MG tablet, Take 1 tablet (5 mg total) by mouth as directed., Disp: 30 tablet, Rfl: 3  Allergies  Allergen Reactions  . Demerol   . Erythromycin   . Meperidine Hcl   .  Penicillins     History   Social History  . Marital Status: Married    Spouse Name: N/A    Number of Children: N/A  . Years of Education: N/A   Occupational History  . Not on file.   Social History Main Topics  . Smoking status: Former Games developer  . Smokeless tobacco: Not on file  . Alcohol Use: Yes  . Drug Use: No  . Sexually Active: Not on file   Other Topics Concern  . Not on file   Social History Narrative  . No narrative on file    Family History  Problem Relation Age of Onset  . Arrhythmia Neg Hx     Physical Exam: Filed Vitals:   10/24/10 1112  BP: 146/79  Pulse: 86  Height: 5\' 5"  (1.651 m)  Weight: 166 lb (75.297 kg)    GEN- The patient is well appearing, alert and oriented x 3 today.   Head- normocephalic, atraumatic Eyes-  Sclera clear, conjunctiva pink Ears- hearing intact Oropharynx- clear Neck- supple, no JVP Lymph- no cervical lymphadenopathy Lungs- Clear to ausculation bilaterally, normal work of breathing Heart- irregular rate and rhythm, 2/6 SEM LUSB GI- soft, NT, ND, + BS Extremities- no clubbing, cyanosis, or edema MS- walks slowly with a walker Skin- no rash or lesion Psych- euthymic mood, full affect Neuro- strength and sensation are intact  ekg today reveals afib, V rate 83 bpm, nonspecific ST/T changes

## 2010-10-24 NOTE — Assessment & Plan Note (Signed)
She has returned to afib Fatigue and SOB may or may not be related.  I will increase amiodarone to 200mg  qam and 100mg  qpm today. Continue coumadin (goal INR 2-3).  If she remains in afib upon return, we will consider cardioversion.

## 2010-10-24 NOTE — Assessment & Plan Note (Signed)
Not volume overloaded today No changes

## 2010-10-24 NOTE — Assessment & Plan Note (Signed)
Stable No change required today  

## 2010-10-24 NOTE — Patient Instructions (Signed)
Your physician recommends that you schedule a follow-up appointment in: 4 weeks with Dr Johney Frame  Your physician has recommended you make the following change in your medication:  1) Increase Amiodarone to 200mg  (2 tablets in the am) and 100mg  ( 1 tablet in the pm)  Your physician recommends that you have  lab work on Friday 10/28/2010 --INR Order already at facility

## 2010-10-28 ENCOUNTER — Ambulatory Visit (INDEPENDENT_AMBULATORY_CARE_PROVIDER_SITE_OTHER): Payer: Self-pay | Admitting: Cardiovascular Disease

## 2010-10-28 DIAGNOSIS — R0989 Other specified symptoms and signs involving the circulatory and respiratory systems: Secondary | ICD-10-CM

## 2010-10-28 LAB — POCT INR: INR: 3

## 2010-10-31 ENCOUNTER — Telehealth: Payer: Self-pay | Admitting: Internal Medicine

## 2010-10-31 MED ORDER — AMIODARONE HCL 100 MG PO TABS: ORAL_TABLET | ORAL | Status: DC

## 2010-10-31 NOTE — Telephone Encounter (Signed)
Amiodarone RX changed to 200 mg in AM and 100 mg in PM. Please call RX into [facility] on [address].

## 2010-11-02 ENCOUNTER — Ambulatory Visit (INDEPENDENT_AMBULATORY_CARE_PROVIDER_SITE_OTHER): Payer: Medicare Other | Admitting: Licensed Clinical Social Worker

## 2010-11-02 DIAGNOSIS — F4323 Adjustment disorder with mixed anxiety and depressed mood: Secondary | ICD-10-CM

## 2010-11-11 ENCOUNTER — Ambulatory Visit (INDEPENDENT_AMBULATORY_CARE_PROVIDER_SITE_OTHER): Payer: Self-pay | Admitting: Cardiology

## 2010-11-11 DIAGNOSIS — R0989 Other specified symptoms and signs involving the circulatory and respiratory systems: Secondary | ICD-10-CM

## 2010-11-11 DIAGNOSIS — I059 Rheumatic mitral valve disease, unspecified: Secondary | ICD-10-CM

## 2010-11-11 DIAGNOSIS — I4891 Unspecified atrial fibrillation: Secondary | ICD-10-CM

## 2010-11-11 LAB — PROTIME-INR: INR: 5.1 — AB (ref 0.9–1.1)

## 2010-11-18 ENCOUNTER — Ambulatory Visit (INDEPENDENT_AMBULATORY_CARE_PROVIDER_SITE_OTHER): Payer: Self-pay | Admitting: Cardiology

## 2010-11-18 DIAGNOSIS — I059 Rheumatic mitral valve disease, unspecified: Secondary | ICD-10-CM

## 2010-11-18 DIAGNOSIS — R0989 Other specified symptoms and signs involving the circulatory and respiratory systems: Secondary | ICD-10-CM

## 2010-11-18 DIAGNOSIS — I4891 Unspecified atrial fibrillation: Secondary | ICD-10-CM

## 2010-11-21 ENCOUNTER — Encounter: Payer: Self-pay | Admitting: *Deleted

## 2010-11-21 ENCOUNTER — Encounter: Payer: Self-pay | Admitting: Internal Medicine

## 2010-11-21 ENCOUNTER — Ambulatory Visit (INDEPENDENT_AMBULATORY_CARE_PROVIDER_SITE_OTHER): Payer: Medicare Other | Admitting: Internal Medicine

## 2010-11-21 VITALS — BP 146/77 | HR 84 | Ht 63.0 in | Wt 161.0 lb

## 2010-11-21 DIAGNOSIS — I509 Heart failure, unspecified: Secondary | ICD-10-CM

## 2010-11-21 DIAGNOSIS — I4891 Unspecified atrial fibrillation: Secondary | ICD-10-CM

## 2010-11-21 DIAGNOSIS — I5032 Chronic diastolic (congestive) heart failure: Secondary | ICD-10-CM

## 2010-11-21 DIAGNOSIS — I10 Essential (primary) hypertension: Secondary | ICD-10-CM

## 2010-11-21 NOTE — Patient Instructions (Signed)
Your physician recommends that you schedule a follow-up appointment in: 7 weeks with Dr Johney Frame  Your physician has recommended that you have a Cardioversion (DCCV). Electrical Cardioversion uses a jolt of electricity to your heart either through paddles or wired patches attached to your chest. This is a controlled, usually prescheduled, procedure. Defibrillation is done under light anesthesia in the hospital, and you usually go home the day of the procedure. This is done to get your heart back into a normal rhythm. You are not awake for the procedure. Please see the instruction sheet given to you today. Needs to do in about 2 weeks  Will need weekly INR's until it's done

## 2010-11-21 NOTE — Assessment & Plan Note (Signed)
She remains in afib Fatigue and SOB may or may not be related.  Continue coumadin (goal INR 2-3) and amiodarone. We will proceed with cardioversion.  IF her symptoms do not improve in sinus, then we will plan rate control going forward, however if her symptoms do improve with sinus then we will plan rhythm control going forward. Risks, benefits, and alternatives to cardioversion were discussed with patient who wishes to proceed.

## 2010-11-21 NOTE — Assessment & Plan Note (Signed)
Stable No change required today  

## 2010-11-21 NOTE — Progress Notes (Signed)
The patient presents today for routine electrophysiology followup.  Since last being seen in our clinic, she reports doing reasonably well.  Her primary concern today is with back pain.  She has chronic back pain but feels that this has been worse recently.  She appears to be minimally symptomatic with afib, though she does report ongoing fatigue. Today, she denies symptoms of palpitations, exertional chest pain,  orthopnea, PND, lower extremity edema, dizziness, presyncope, syncope, or neurologic sequela.  The patient feels that she is tolerating medications without difficulties and is otherwise without complaint today.   Past Medical History  Diagnosis Date  . Persistent atrial fibrillation   . Sinus bradycardia   . Edema   . DDD (degenerative disc disease)   . HTN (hypertension)   . Depression, reactive, psychotic   . Mitral regurgitation    Past Surgical History  Procedure Date  . Laminectomy   . Cesarean section     Current outpatient prescriptions:ALPRAZolam (XANAX) 0.25 MG tablet, Take 0.25 mg by mouth 2 (two) times daily. , Disp: , Rfl: ;  amiodarone (PACERONE) 100 MG tablet, Take 2 tablets in the am and 1 tablet in the pm, Disp: 90 tablet, Rfl: 3;  HYDROmorphone HCl (EXALGO) 16 MG TB24, Take 1 tablet by mouth daily.  , Disp: , Rfl: ;  Methylfol-Methylcob-Acetylcyst (CEREFOLIN NAC) 6-2-600 MG TABS, Take 1 tablet by mouth 2 (two) times daily. , Disp: , Rfl:  naproxen sodium (ANAPROX) 220 MG tablet, Take 220 mg by mouth 2 (two) times daily with a meal.  , Disp: , Rfl: ;  polyethylene glycol (MIRALAX / GLYCOLAX) packet, Take 17 g by mouth as needed.  , Disp: , Rfl: ;  senna (SENOKOT) 8.6 MG tablet, Take 1 tablet by mouth at bedtime.  , Disp: , Rfl: ;  tetracaine (PONTOCAINE) 0.5 % ophthalmic solution, 1 drop once.  , Disp: , Rfl:  vitamin B-12 (CYANOCOBALAMIN) 1000 MCG tablet, Take 1,000 mcg by mouth daily.  , Disp: , Rfl: ;  warfarin (COUMADIN) 5 MG tablet, Take 1 tablet (5 mg total) by  mouth as directed., Disp: 30 tablet, Rfl: 3;  lisinopril (PRINIVIL,ZESTRIL) 10 MG tablet, take 1 tablet by mouth once daily, Disp: 30 tablet, Rfl: 11;  traZODone (DESYREL) 50 MG tablet, Take 50 mg by mouth at bedtime.  , Disp: , Rfl:  venlafaxine (EFFEXOR-XR) 75 MG 24 hr capsule, Take 75 mg by mouth daily.  , Disp: , Rfl:   Allergies  Allergen Reactions  . Demerol   . Erythromycin   . Meperidine Hcl   . Penicillins     History   Social History  . Marital Status: Married    Spouse Name: N/A    Number of Children: N/A  . Years of Education: N/A   Occupational History  . Not on file.   Social History Main Topics  . Smoking status: Former Games developer  . Smokeless tobacco: Not on file  . Alcohol Use: Yes  . Drug Use: No  . Sexually Active: Not on file   Other Topics Concern  . Not on file   Social History Narrative  . No narrative on file    Family History  Problem Relation Age of Onset  . Arrhythmia Neg Hx     Physical Exam: Filed Vitals:   11/21/10 1230  BP: 146/77  Pulse: 84  Height: 5\' 3"  (1.6 m)  Weight: 161 lb (73.029 kg)    GEN- The patient is well appearing, alert and oriented  x 3 today.   Head- normocephalic, atraumatic Eyes-  Sclera clear, conjunctiva pink Ears- hearing intact Oropharynx- clear Neck- supple, no JVP Lymph- no cervical lymphadenopathy Lungs- Clear to ausculation bilaterally, normal work of breathing Heart- irregular rate and rhythm, 2/6 SEM LUSB GI- soft, NT, ND, + BS Extremities- no clubbing, cyanosis, or edema MS- walks slowly with a walker Skin- no rash or lesion Psych- euthymic mood, full affect Neuro- strength and sensation are intact  ekg today reveals afib, V rate 60 bpm, incomplete RBBB

## 2010-11-25 ENCOUNTER — Ambulatory Visit (INDEPENDENT_AMBULATORY_CARE_PROVIDER_SITE_OTHER): Payer: Medicare Other | Admitting: *Deleted

## 2010-11-25 ENCOUNTER — Ambulatory Visit (INDEPENDENT_AMBULATORY_CARE_PROVIDER_SITE_OTHER): Payer: Self-pay | Admitting: Cardiovascular Disease

## 2010-11-25 DIAGNOSIS — I4891 Unspecified atrial fibrillation: Secondary | ICD-10-CM

## 2010-11-25 DIAGNOSIS — I10 Essential (primary) hypertension: Secondary | ICD-10-CM

## 2010-11-25 DIAGNOSIS — I498 Other specified cardiac arrhythmias: Secondary | ICD-10-CM

## 2010-11-25 DIAGNOSIS — I059 Rheumatic mitral valve disease, unspecified: Secondary | ICD-10-CM

## 2010-11-25 DIAGNOSIS — R0989 Other specified symptoms and signs involving the circulatory and respiratory systems: Secondary | ICD-10-CM

## 2010-11-25 LAB — BASIC METABOLIC PANEL
BUN: 15 mg/dL (ref 6–23)
Chloride: 106 mEq/L (ref 96–112)
Potassium: 4.2 mEq/L (ref 3.5–5.1)
Sodium: 138 mEq/L (ref 135–145)

## 2010-11-25 LAB — PROTIME-INR
INR: 4.6 ratio — ABNORMAL HIGH (ref 0.8–1.0)
Prothrombin Time: 51.6 s — ABNORMAL HIGH (ref 10.2–12.4)

## 2010-11-25 LAB — CBC WITH DIFFERENTIAL/PLATELET
Basophils Relative: 0.5 % (ref 0.0–3.0)
Eosinophils Relative: 0.7 % (ref 0.0–5.0)
Hemoglobin: 12.5 g/dL (ref 12.0–15.0)
Lymphocytes Relative: 21.9 % (ref 12.0–46.0)
MCHC: 32.9 g/dL (ref 30.0–36.0)
Monocytes Relative: 7.9 % (ref 3.0–12.0)
Neutro Abs: 4.2 10*3/uL (ref 1.4–7.7)
Neutrophils Relative %: 69 % (ref 43.0–77.0)
RBC: 4.01 Mil/uL (ref 3.87–5.11)
WBC: 6.1 10*3/uL (ref 4.5–10.5)

## 2010-11-28 ENCOUNTER — Encounter (HOSPITAL_COMMUNITY): Payer: Self-pay | Admitting: Pharmacy Technician

## 2010-11-29 ENCOUNTER — Telehealth: Payer: Self-pay | Admitting: Internal Medicine

## 2010-11-29 NOTE — Telephone Encounter (Signed)
Faxed to Nursing home.

## 2010-11-29 NOTE — Telephone Encounter (Signed)
Pt calling wanting our office to fax instructions for cardioversion this Friday to [facility]. Fax: [phone]

## 2010-12-01 ENCOUNTER — Other Ambulatory Visit: Payer: Self-pay | Admitting: Internal Medicine

## 2010-12-01 ENCOUNTER — Ambulatory Visit: Payer: Medicare Other | Admitting: Licensed Clinical Social Worker

## 2010-12-01 ENCOUNTER — Ambulatory Visit (INDEPENDENT_AMBULATORY_CARE_PROVIDER_SITE_OTHER): Payer: Self-pay | Admitting: Cardiovascular Disease

## 2010-12-01 DIAGNOSIS — R0989 Other specified symptoms and signs involving the circulatory and respiratory systems: Secondary | ICD-10-CM

## 2010-12-01 DIAGNOSIS — I059 Rheumatic mitral valve disease, unspecified: Secondary | ICD-10-CM

## 2010-12-01 DIAGNOSIS — I4891 Unspecified atrial fibrillation: Secondary | ICD-10-CM

## 2010-12-01 NOTE — Telephone Encounter (Signed)
rx request 

## 2010-12-02 ENCOUNTER — Ambulatory Visit (HOSPITAL_COMMUNITY): Payer: Medicare Other | Admitting: Anesthesiology

## 2010-12-02 ENCOUNTER — Encounter (HOSPITAL_COMMUNITY): Payer: Self-pay | Admitting: Anesthesiology

## 2010-12-02 ENCOUNTER — Ambulatory Visit (HOSPITAL_COMMUNITY)
Admission: RE | Admit: 2010-12-02 | Discharge: 2010-12-02 | Disposition: A | Discharge status: Home / Self Care | Payer: Medicare Other | Source: Ambulatory Visit | Attending: Cardiology | Admitting: Cardiology

## 2010-12-02 ENCOUNTER — Other Ambulatory Visit: Payer: Self-pay

## 2010-12-02 ENCOUNTER — Encounter (HOSPITAL_COMMUNITY)
Admission: RE | Disposition: A | Discharge status: Home / Self Care | Payer: Self-pay | Source: Ambulatory Visit | Attending: Cardiology

## 2010-12-02 DIAGNOSIS — I4891 Unspecified atrial fibrillation: Secondary | ICD-10-CM

## 2010-12-02 DIAGNOSIS — I10 Essential (primary) hypertension: Secondary | ICD-10-CM | POA: Insufficient documentation

## 2010-12-02 DIAGNOSIS — Z01810 Encounter for preprocedural cardiovascular examination: Secondary | ICD-10-CM | POA: Insufficient documentation

## 2010-12-02 HISTORY — PX: CARDIOVERSION: SHX1299

## 2010-12-02 LAB — PROTIME-INR
INR: 3.96 — ABNORMAL HIGH (ref 0.00–1.49)
Prothrombin Time: 39.3 seconds — ABNORMAL HIGH (ref 11.6–15.2)

## 2010-12-02 SURGERY — CARDIOVERSION
Anesthesia: Moderate Sedation | Wound class: Clean

## 2010-12-02 MED ORDER — SODIUM CHLORIDE 0.45 % IV SOLN: INTRAVENOUS | Status: DC

## 2010-12-02 MED ORDER — HYDROCORTISONE 1 % EX CREA
1.0000 "application " | TOPICAL_CREAM | Freq: Three times a day (TID) | CUTANEOUS | Status: DC | PRN
  Filled 2010-12-02: qty 28

## 2010-12-02 MED ORDER — PROPOFOL 10 MG/ML IV EMUL
INTRAVENOUS | Status: DC | PRN
  Administered 2010-12-02: 60 mg via INTRAVENOUS

## 2010-12-02 MED ORDER — LIDOCAINE HCL 1 % IJ SOLN
INTRAMUSCULAR | Status: DC | PRN
  Administered 2010-12-02: 15 mg via INTRADERMAL

## 2010-12-02 NOTE — Anesthesia Preprocedure Evaluation (Addendum)
Anesthesia Evaluation  Patient identified by MRN, date of birth, ID band Patient awake    Reviewed: Allergy & Precautions, H&P , NPO status , Patient's Chart, lab work & pertinent test results  Airway       Dental  (+) Edentulous Upper   Pulmonary  clear to auscultation        Cardiovascular hypertension, Pt. on medications irregular     Neuro/Psych PSYCHIATRIC DISORDERS    GI/Hepatic   Endo/Other    Renal/GU      Musculoskeletal   Abdominal   Peds  Hematology   Anesthesia Other Findings   Reproductive/Obstetrics                         Anesthesia Physical Anesthesia Plan  ASA: III  Anesthesia Plan: General   Post-op Pain Management:    Induction: Intravenous  Airway Management Planned: Mask  Additional Equipment:   Intra-op Plan:   Post-operative Plan:   Informed Consent:   Dental advisory given  Plan Discussed with: Anesthesiologist  Anesthesia Plan Comments:         Anesthesia Quick Evaluation

## 2010-12-02 NOTE — Anesthesia Postprocedure Evaluation (Signed)
  Anesthesia Post-op Note  [name]  Procedure(s) Performed:  CARDIOVERSION - OUTPATIENT CARDIOVERSION  Patient Location: Short Stay  Anesthesia Type: General  Level of Consciousness: awake and oriented  Airway and Oxygen Therapy: Patient Spontanous Breathing and Patient connected to nasal cannula oxygen  Post-op Pain: none  Post-op Assessment: Post-op Vital signs reviewed and Patient's Cardiovascular Status Stable  Post-op Vital Signs: stable  Complications: No apparent anesthesia complications

## 2010-12-02 NOTE — Transfer of Care (Signed)
Immediate Anesthesia Transfer of Care Note  [name]  Procedure(s) Performed:  CARDIOVERSION - OUTPATIENT CARDIOVERSION  Patient Location: Short Stay  Anesthesia Type: General  Level of Consciousness: awake, alert  and oriented  Airway & Oxygen Therapy: Patient Spontanous Breathing and Patient connected to nasal cannula oxygen  Post-op Assessment: Report given to PACU RN and Post -op Vital signs reviewed and stable  Post vital signs: stable  Complications: No apparent anesthesia complications

## 2010-12-02 NOTE — Preoperative (Signed)
Beta Blockers   Reason not to administer Beta Blockers:Not Applicable 

## 2010-12-02 NOTE — Procedures (Signed)
Electrical Cardioversion Procedure Note Kathryn Smith [id number] 09-04-1932  Procedure: Electrical Cardioversion Indications:  Atrial Fibrillation  Procedure Details Consent: Risks of procedure as well as the alternatives and risks of each were explained to the (patient/caregiver).  Consent for procedure obtained. Time Out: Verified patient identification, verified procedure, site/side was marked, verified correct patient position, special equipment/implants available, medications/allergies/relevent history reviewed, required imaging and test results available.  Performed  Patient placed on cardiac monitor, pulse oximetry, supplemental oxygen as necessary.  Sedation given: Per Anesthesia Pacer pads placed anterior and posterior chest.  Cardioverted 1 time(s).  Cardioverted at 120J.  Evaluation Findings: Post procedure EKG shows: NSR Complications: None Patient did tolerate procedure well.   Kathryn Smith 12/02/2010, 3:15 PM

## 2010-12-02 NOTE — Progress Notes (Signed)
Pt tolerated procedure well shocked x1 at 120J anesthesia and dr Myrtis Ser at beside will continue to monitor

## 2010-12-07 ENCOUNTER — Encounter (HOSPITAL_COMMUNITY): Payer: Self-pay | Admitting: Cardiology

## 2010-12-09 ENCOUNTER — Ambulatory Visit (INDEPENDENT_AMBULATORY_CARE_PROVIDER_SITE_OTHER): Payer: Self-pay | Admitting: Cardiology

## 2010-12-09 DIAGNOSIS — I059 Rheumatic mitral valve disease, unspecified: Secondary | ICD-10-CM

## 2010-12-09 DIAGNOSIS — R0989 Other specified symptoms and signs involving the circulatory and respiratory systems: Secondary | ICD-10-CM

## 2010-12-09 DIAGNOSIS — I4891 Unspecified atrial fibrillation: Secondary | ICD-10-CM

## 2010-12-09 LAB — PROTIME-INR: INR: 3.2 — AB (ref 0.9–1.1)

## 2010-12-19 ENCOUNTER — Telehealth: Payer: Self-pay | Admitting: Internal Medicine

## 2010-12-19 ENCOUNTER — Ambulatory Visit (INDEPENDENT_AMBULATORY_CARE_PROVIDER_SITE_OTHER): Payer: Self-pay | Admitting: Internal Medicine

## 2010-12-19 DIAGNOSIS — I059 Rheumatic mitral valve disease, unspecified: Secondary | ICD-10-CM

## 2010-12-19 DIAGNOSIS — R0989 Other specified symptoms and signs involving the circulatory and respiratory systems: Secondary | ICD-10-CM

## 2010-12-19 DIAGNOSIS — I4891 Unspecified atrial fibrillation: Secondary | ICD-10-CM

## 2010-12-19 LAB — POCT INR: INR: 2.5

## 2010-12-19 MED ORDER — DIPHENHYD-HC-NYSTATIN-TETRACYC MT SUSP: OROMUCOSAL | Status: DC

## 2010-12-19 NOTE — Telephone Encounter (Signed)
E script sent accidentally to [facility] for nystatin, nurse called pharmacy and cancelled

## 2010-12-19 NOTE — Telephone Encounter (Signed)
E script accidentally sent to [facility] for nystatin, nurse discontinued med and called [facility] to cancel script

## 2010-12-19 NOTE — Telephone Encounter (Signed)
New message:  Kathryn Smith has a question about an e scribe he got today.  Please call him back.

## 2010-12-21 ENCOUNTER — Telehealth: Payer: Self-pay | Admitting: Internal Medicine

## 2010-12-29 ENCOUNTER — Ambulatory Visit (INDEPENDENT_AMBULATORY_CARE_PROVIDER_SITE_OTHER): Payer: Medicare Other | Admitting: Licensed Clinical Social Worker

## 2010-12-29 DIAGNOSIS — F4323 Adjustment disorder with mixed anxiety and depressed mood: Secondary | ICD-10-CM

## 2011-01-02 ENCOUNTER — Encounter: Payer: Self-pay | Admitting: Internal Medicine

## 2011-01-02 ENCOUNTER — Ambulatory Visit (INDEPENDENT_AMBULATORY_CARE_PROVIDER_SITE_OTHER): Payer: Self-pay | Admitting: Internal Medicine

## 2011-01-02 DIAGNOSIS — I059 Rheumatic mitral valve disease, unspecified: Secondary | ICD-10-CM

## 2011-01-02 DIAGNOSIS — I4891 Unspecified atrial fibrillation: Secondary | ICD-10-CM

## 2011-01-02 DIAGNOSIS — R0989 Other specified symptoms and signs involving the circulatory and respiratory systems: Secondary | ICD-10-CM

## 2011-01-02 LAB — POCT INR
INR: 2.7
INR: 3.8

## 2011-01-02 NOTE — Progress Notes (Signed)
This encounter was created in error - please disregard.

## 2011-01-23 ENCOUNTER — Encounter: Payer: Self-pay | Admitting: Internal Medicine

## 2011-01-23 ENCOUNTER — Ambulatory Visit (INDEPENDENT_AMBULATORY_CARE_PROVIDER_SITE_OTHER): Payer: Medicare Other | Admitting: Internal Medicine

## 2011-01-23 DIAGNOSIS — I509 Heart failure, unspecified: Secondary | ICD-10-CM

## 2011-01-23 DIAGNOSIS — I4891 Unspecified atrial fibrillation: Secondary | ICD-10-CM

## 2011-01-23 DIAGNOSIS — I5032 Chronic diastolic (congestive) heart failure: Secondary | ICD-10-CM

## 2011-01-23 MED ORDER — AMIODARONE HCL 200 MG PO TABS: ORAL_TABLET | ORAL | Status: DC

## 2011-01-23 NOTE — Patient Instructions (Signed)
Your physician wants you to follow-up in: 6 months with Dr Jacquiline Doe will receive a reminder letter in the mail two months in advance. If you don't receive a letter, please call our office to schedule the follow-up appointment.   Your physician has recommended you make the following change in your medication:  1) Decrease Amiodarone to 200mg  daily

## 2011-01-23 NOTE — Assessment & Plan Note (Signed)
Maintaining sinus rhythm Decrease amiodarone to 200mg  daily Goal INR 2-3

## 2011-01-23 NOTE — Assessment & Plan Note (Signed)
euvolemic today No changes 

## 2011-01-23 NOTE — Progress Notes (Signed)
The patient presents today for routine electrophysiology followup.  Since her recent cardioversion, she reports doing reasonably well.  She hs not noticed significant improvements with sinus rhythm. Today, she denies symptoms of palpitations, exertional chest pain,  orthopnea, PND, lower extremity edema, dizziness, presyncope, syncope, or neurologic sequela.  The patient feels that she is tolerating medications without difficulties and is otherwise without complaint today.   Past Medical History  Diagnosis Date  . Persistent atrial fibrillation   . Sinus bradycardia   . Edema   . DDD (degenerative disc disease)   . HTN (hypertension)   . Depression, reactive, psychotic   . Mitral regurgitation    Past Surgical History  Procedure Date  . Laminectomy   . Cesarean section   . Cardioversion 12/02/2010    Procedure: CARDIOVERSION;  Surgeon: Marca Ancona, MD;  Location: [facility] OR;  Service: Cardiovascular;  Laterality: N/A;  OUTPATIENT CARDIOVERSION    Current outpatient prescriptions:ALPRAZolam (XANAX) 0.25 MG tablet, Take 0.5 mg by mouth 2 (two) times daily. , Disp: , Rfl: ;  amiodarone (PACERONE) 200 MG tablet, Take one daily, Disp: 30 tablet, Rfl: 11;  HYDROmorphone HCl (EXALGO) 16 MG TB24, Take 1 tablet by mouth daily. , Disp: , Rfl: ;  lisinopril (PRINIVIL,ZESTRIL) 10 MG tablet, take 1 tablet by mouth once daily, Disp: 30 tablet, Rfl: 11 Methylfol-Methylcob-Acetylcyst (CEREFOLIN NAC) 6-2-600 MG TABS, Take 1 tablet by mouth 2 (two) times daily. , Disp: , Rfl: ;  oxyCODONE-acetaminophen (PERCOCET) 5-325 MG per tablet, Take 1 tablet by mouth every 6 (six) hours as needed. For breakthrough pain , Disp: , Rfl: ;  senna (SENOKOT) 8.6 MG tablet, Take 2 tablets by mouth at bedtime. , Disp: , Rfl: ;  traZODone (DESYREL) 50 MG tablet, Take 150 mg by mouth at bedtime. , Disp: , Rfl:  venlafaxine (EFFEXOR-XR) 75 MG 24 hr capsule, Take 75 mg by mouth daily.  , Disp: , Rfl: ;  warfarin (COUMADIN) 5 MG tablet,  Take 2.5-5 mg by mouth daily. Patient takes 2.5 mg daily except on Monday and Friday.  Monday and Friday=5mg  , Disp: , Rfl: ;  warfarin (COUMADIN) 5 MG tablet, Take as directed by anticoagulation clinic, Disp: 30 tablet, Rfl: 3 DISCONTD: amiodarone (PACERONE) 100 MG tablet, Take 2 tablets in the am and 1 tablet in the pm, Disp: 90 tablet, Rfl: 3;  DISCONTD: amiodarone (PACERONE) 100 MG tablet, Take 200 mg by mouth 2 (two) times daily. Take 2 tablets in the am and 1 tablet in the pm , Disp: , Rfl:   Allergies  Allergen Reactions  . Demerol   . Erythromycin   . Meperidine Hcl   . Penicillins     History   Social History  . Marital Status: Married    Spouse Name: N/A    Number of Children: N/A  . Years of Education: N/A   Occupational History  . Not on file.   Social History Main Topics  . Smoking status: Former Games developer  . Smokeless tobacco: Not on file  . Alcohol Use: Yes  . Drug Use: No  . Sexually Active: Not on file   Other Topics Concern  . Not on file   Social History Narrative  . No narrative on file    Family History  Problem Relation Age of Onset  . Arrhythmia Neg Hx     Physical Exam: Filed Vitals:   01/23/11 1106  BP: 128/56  Pulse: 52  Height: 5\' 4"  (1.626 m)  Weight: 150 lb  6.4 oz (68.221 kg)    GEN- The patient is well appearing, alert and oriented x 3 today.   Head- normocephalic, atraumatic Eyes-  Sclera clear, conjunctiva pink Ears- hearing intact Oropharynx- clear Neck- supple, no JVP Lymph- no cervical lymphadenopathy Lungs- Clear to ausculation bilaterally, normal work of breathing Heart- regular rate and rhythm, 2/6 SEM LUSB GI- soft, NT, ND, + BS Extremities- no clubbing, cyanosis, or edema MS- walks slowly with a walker Skin- no rash or lesion Psych- euthymic mood, full affect Neuro- strength and sensation are intact  ekg today reveals sinus rhythm 52 bpm, V rate 60 bpm, incomplete RBBB

## 2011-01-30 ENCOUNTER — Ambulatory Visit (INDEPENDENT_AMBULATORY_CARE_PROVIDER_SITE_OTHER): Payer: Self-pay | Admitting: Cardiology

## 2011-01-30 DIAGNOSIS — R0989 Other specified symptoms and signs involving the circulatory and respiratory systems: Secondary | ICD-10-CM

## 2011-01-30 DIAGNOSIS — I4891 Unspecified atrial fibrillation: Secondary | ICD-10-CM

## 2011-01-30 DIAGNOSIS — I059 Rheumatic mitral valve disease, unspecified: Secondary | ICD-10-CM

## 2011-02-08 ENCOUNTER — Ambulatory Visit (INDEPENDENT_AMBULATORY_CARE_PROVIDER_SITE_OTHER): Payer: Medicare Other | Admitting: Licensed Clinical Social Worker

## 2011-02-08 DIAGNOSIS — F4323 Adjustment disorder with mixed anxiety and depressed mood: Secondary | ICD-10-CM

## 2011-02-27 ENCOUNTER — Ambulatory Visit (INDEPENDENT_AMBULATORY_CARE_PROVIDER_SITE_OTHER): Payer: Self-pay | Admitting: Cardiology

## 2011-02-27 DIAGNOSIS — R0989 Other specified symptoms and signs involving the circulatory and respiratory systems: Secondary | ICD-10-CM

## 2011-02-27 DIAGNOSIS — I4891 Unspecified atrial fibrillation: Secondary | ICD-10-CM

## 2011-02-27 DIAGNOSIS — I059 Rheumatic mitral valve disease, unspecified: Secondary | ICD-10-CM

## 2011-03-01 ENCOUNTER — Ambulatory Visit (INDEPENDENT_AMBULATORY_CARE_PROVIDER_SITE_OTHER): Payer: Medicare Other | Admitting: Licensed Clinical Social Worker

## 2011-03-01 DIAGNOSIS — F4323 Adjustment disorder with mixed anxiety and depressed mood: Secondary | ICD-10-CM

## 2011-03-20 ENCOUNTER — Ambulatory Visit (INDEPENDENT_AMBULATORY_CARE_PROVIDER_SITE_OTHER): Payer: Medicare Other | Admitting: Licensed Clinical Social Worker

## 2011-03-20 DIAGNOSIS — F4323 Adjustment disorder with mixed anxiety and depressed mood: Secondary | ICD-10-CM

## 2011-03-22 ENCOUNTER — Ambulatory Visit: Payer: Medicare Other | Admitting: Licensed Clinical Social Worker

## 2011-03-27 ENCOUNTER — Ambulatory Visit: Payer: Self-pay | Admitting: Cardiology

## 2011-03-27 DIAGNOSIS — I059 Rheumatic mitral valve disease, unspecified: Secondary | ICD-10-CM

## 2011-03-27 DIAGNOSIS — I4891 Unspecified atrial fibrillation: Secondary | ICD-10-CM

## 2011-04-10 ENCOUNTER — Ambulatory Visit (INDEPENDENT_AMBULATORY_CARE_PROVIDER_SITE_OTHER): Payer: Medicare Other | Admitting: Licensed Clinical Social Worker

## 2011-04-10 DIAGNOSIS — F4323 Adjustment disorder with mixed anxiety and depressed mood: Secondary | ICD-10-CM

## 2011-04-24 ENCOUNTER — Ambulatory Visit (INDEPENDENT_AMBULATORY_CARE_PROVIDER_SITE_OTHER): Payer: Medicare Other | Admitting: Cardiology

## 2011-04-24 DIAGNOSIS — I059 Rheumatic mitral valve disease, unspecified: Secondary | ICD-10-CM

## 2011-04-24 DIAGNOSIS — I4891 Unspecified atrial fibrillation: Secondary | ICD-10-CM

## 2011-05-08 ENCOUNTER — Ambulatory Visit: Payer: Self-pay | Admitting: Internal Medicine

## 2011-05-08 ENCOUNTER — Ambulatory Visit (INDEPENDENT_AMBULATORY_CARE_PROVIDER_SITE_OTHER): Payer: Medicare Other | Admitting: Licensed Clinical Social Worker

## 2011-05-08 DIAGNOSIS — I059 Rheumatic mitral valve disease, unspecified: Secondary | ICD-10-CM

## 2011-05-08 DIAGNOSIS — F4323 Adjustment disorder with mixed anxiety and depressed mood: Secondary | ICD-10-CM

## 2011-05-08 DIAGNOSIS — I4891 Unspecified atrial fibrillation: Secondary | ICD-10-CM

## 2011-05-15 ENCOUNTER — Ambulatory Visit: Payer: Self-pay | Admitting: Cardiology

## 2011-05-15 DIAGNOSIS — I4891 Unspecified atrial fibrillation: Secondary | ICD-10-CM

## 2011-05-15 DIAGNOSIS — I059 Rheumatic mitral valve disease, unspecified: Secondary | ICD-10-CM

## 2011-05-22 ENCOUNTER — Ambulatory Visit (INDEPENDENT_AMBULATORY_CARE_PROVIDER_SITE_OTHER): Payer: Medicare Other | Admitting: Licensed Clinical Social Worker

## 2011-05-22 ENCOUNTER — Ambulatory Visit: Payer: Self-pay | Admitting: Cardiology

## 2011-05-22 DIAGNOSIS — F4323 Adjustment disorder with mixed anxiety and depressed mood: Secondary | ICD-10-CM

## 2011-05-22 DIAGNOSIS — I4891 Unspecified atrial fibrillation: Secondary | ICD-10-CM

## 2011-05-22 DIAGNOSIS — I059 Rheumatic mitral valve disease, unspecified: Secondary | ICD-10-CM

## 2011-05-22 LAB — POCT INR: INR: 1.3

## 2011-05-29 ENCOUNTER — Ambulatory Visit: Payer: Self-pay | Admitting: Cardiology

## 2011-05-29 DIAGNOSIS — I059 Rheumatic mitral valve disease, unspecified: Secondary | ICD-10-CM

## 2011-05-29 DIAGNOSIS — I4891 Unspecified atrial fibrillation: Secondary | ICD-10-CM

## 2011-06-05 ENCOUNTER — Ambulatory Visit: Payer: Self-pay | Admitting: Cardiology

## 2011-06-05 DIAGNOSIS — I059 Rheumatic mitral valve disease, unspecified: Secondary | ICD-10-CM

## 2011-06-05 DIAGNOSIS — I4891 Unspecified atrial fibrillation: Secondary | ICD-10-CM

## 2011-06-05 LAB — POCT INR: INR: 1.8

## 2011-06-12 ENCOUNTER — Ambulatory Visit (INDEPENDENT_AMBULATORY_CARE_PROVIDER_SITE_OTHER): Payer: Medicare Other | Admitting: Licensed Clinical Social Worker

## 2011-06-12 ENCOUNTER — Ambulatory Visit: Payer: Self-pay | Admitting: Internal Medicine

## 2011-06-12 DIAGNOSIS — F4323 Adjustment disorder with mixed anxiety and depressed mood: Secondary | ICD-10-CM

## 2011-06-12 DIAGNOSIS — I4891 Unspecified atrial fibrillation: Secondary | ICD-10-CM

## 2011-06-12 DIAGNOSIS — I059 Rheumatic mitral valve disease, unspecified: Secondary | ICD-10-CM

## 2011-06-12 LAB — POCT INR: INR: 1.7

## 2011-06-12 MED ORDER — WARFARIN SODIUM 5 MG PO TABS: ORAL_TABLET | ORAL | Status: DC

## 2011-06-22 ENCOUNTER — Telehealth: Payer: Self-pay | Admitting: *Deleted

## 2011-06-22 NOTE — Telephone Encounter (Signed)
Received call from [place] at [place] stating that pt went out of town left may 28th  Should be back tomorrow instructed to have INR done on 31st and if does not return to have it done first  thing on June 3rd

## 2011-06-23 ENCOUNTER — Ambulatory Visit (INDEPENDENT_AMBULATORY_CARE_PROVIDER_SITE_OTHER): Payer: Medicare HMO | Admitting: Internal Medicine

## 2011-06-23 DIAGNOSIS — I4891 Unspecified atrial fibrillation: Secondary | ICD-10-CM

## 2011-06-23 DIAGNOSIS — I059 Rheumatic mitral valve disease, unspecified: Secondary | ICD-10-CM

## 2011-06-23 LAB — POCT INR: INR: 2.7

## 2011-06-26 ENCOUNTER — Telehealth: Payer: Self-pay | Admitting: Internal Medicine

## 2011-06-26 ENCOUNTER — Emergency Department (HOSPITAL_COMMUNITY)
Admission: EM | Admit: 2011-06-26 | Discharge: 2011-06-26 | Disposition: A | Discharge status: Home / Self Care | Payer: Medicare HMO | Attending: Emergency Medicine | Admitting: Emergency Medicine

## 2011-06-26 ENCOUNTER — Encounter (HOSPITAL_COMMUNITY): Payer: Self-pay | Admitting: *Deleted

## 2011-06-26 ENCOUNTER — Emergency Department (HOSPITAL_COMMUNITY): Payer: Medicare HMO

## 2011-06-26 DIAGNOSIS — R079 Chest pain, unspecified: Secondary | ICD-10-CM | POA: Insufficient documentation

## 2011-06-26 LAB — DIFFERENTIAL
Basophils Relative: 0 % (ref 0–1)
Lymphs Abs: 1.6 10*3/uL (ref 0.7–4.0)
Monocytes Absolute: 0.5 10*3/uL (ref 0.1–1.0)
Monocytes Relative: 9 % (ref 3–12)
Neutro Abs: 2.9 10*3/uL (ref 1.7–7.7)
Neutrophils Relative %: 56 % (ref 43–77)

## 2011-06-26 LAB — BASIC METABOLIC PANEL
BUN: 11 mg/dL (ref 6–23)
CO2: 28 mEq/L (ref 19–32)
Chloride: 99 mEq/L (ref 96–112)
Creatinine, Ser: 0.67 mg/dL (ref 0.50–1.10)
Potassium: 4.2 mEq/L (ref 3.5–5.1)

## 2011-06-26 LAB — CBC
HCT: 37.4 % (ref 36.0–46.0)
Hemoglobin: 12 g/dL (ref 12.0–15.0)
MCHC: 32.1 g/dL (ref 30.0–36.0)
RBC: 4.02 MIL/uL (ref 3.87–5.11)

## 2011-06-26 LAB — TROPONIN I
Troponin I: <0.3 ng/mL (ref <0.30)
Troponin I: <0.3 ng/mL (ref <0.30)

## 2011-06-26 LAB — PROTIME-INR: INR: 1.86 — ABNORMAL HIGH (ref 0.00–1.49)

## 2011-06-26 MED ORDER — OXYCODONE-ACETAMINOPHEN 5-325 MG PO TABS
1.0000 | ORAL_TABLET | Freq: Once | ORAL | Status: AC
  Administered 2011-06-26: 1 via ORAL
  Filled 2011-06-26: qty 1

## 2011-06-26 NOTE — Telephone Encounter (Signed)
New msg Penny burn [employer] called and said pt has been having chest pain today that has been coming and going. Pt does not want to go to er. They want refill of nitroglycerin

## 2011-06-26 NOTE — Telephone Encounter (Signed)
I spoke with pt's daughter, Randa Evens. She states nurse at [facility] did not feel pt needed to be transported by 911 to  go to ED.  She is requesting appt in office today. Daughter states pt continues to have chest pain with diaphoresis.  I confirmed with daughter that pt had not had chest pain before.  I again recommended that pt be transported to ED for further evaluation.

## 2011-06-26 NOTE — Telephone Encounter (Signed)
Spoke with [place]. Pt developed chest pain with diaphoresis while visiting husband.  Hart Carwin states pt has not complained about chest pain before and pt does not want to go to ED. Hart Carwin is requesting a prescription for NTG. Hart Carwin states pt states chest pain is better but she still has it. I recommended call 911 for transport to ED.

## 2011-06-26 NOTE — Telephone Encounter (Signed)
Pt is complaining of chest pain and wants to be seen today

## 2011-06-26 NOTE — Telephone Encounter (Signed)
New problem:  Marine scientist, never receive her name, patient family wants to know can she be seen today. Line was disconnected.  Call and spoke with margaret, who advise me that Hart Carwin has left for the day.  patient still there. read notes from Katina Dung, advise Claris Che patient should be transport by ems or go er. Daughter Randa Evens is coming to get patient.

## 2011-06-26 NOTE — ED Notes (Signed)
MD at bedside. 

## 2011-06-26 NOTE — Telephone Encounter (Signed)
See previous phone notes 

## 2011-06-26 NOTE — Discharge Instructions (Signed)
Return for any new or worse chest pain make an appointment with your cardiologist for followup sometime later this week or next week. Continue current medications. Cardiac markers today were negative chest x-ray was negative.

## 2011-06-26 NOTE — ED Provider Notes (Signed)
History     CSN: [id number]  Arrival date & time 06/26/11  1638   First MD Initiated Contact with Patient 06/26/11 1939      Chief Complaint  Patient presents with  . Chest Pain    (Consider location/radiation/quality/duration/timing/severity/associated sxs/prior treatment) Patient is a 78 y.o. female presenting with chest pain. The history is provided by the patient and a relative.  Chest Pain The chest pain began 6 - 12 hours ago. Duration of episode(s) is 20 minutes. Chest pain occurs constantly. The chest pain is resolved. At its most intense, the pain is at 8/10. The pain is currently at 0/10. The severity of the pain is moderate. The quality of the pain is described as sharp. The pain radiates to the left jaw and left neck. Primary symptoms include shortness of breath. Pertinent negatives for primary symptoms include no fever, no fatigue, no syncope, no cough, no wheezing, no palpitations, no abdominal pain, no nausea, no vomiting and no dizziness.     Past Medical History  Diagnosis Date  . Persistent atrial fibrillation   . Sinus bradycardia   . Edema   . DDD (degenerative disc disease)   . HTN (hypertension)   . Depression, reactive, psychotic   . Mitral regurgitation     Past Surgical History  Procedure Date  . Laminectomy   . Cesarean section   . Cardioversion 12/02/2010    Procedure: CARDIOVERSION;  Surgeon: Marca Ancona, MD;  Location: [facility] OR;  Service: Cardiovascular;  Laterality: N/A;  OUTPATIENT CARDIOVERSION    Family History  Problem Relation Age of Onset  . Arrhythmia Neg Hx     History  Substance Use Topics  . Smoking status: Former Games developer  . Smokeless tobacco: Not on file  . Alcohol Use: Yes     none    OB History    Grav Para Term Preterm Abortions TAB SAB Ect Mult Living                  Review of Systems  Constitutional: Negative for fever and fatigue.  HENT: Negative for congestion.   Respiratory: Positive for shortness of breath.  Negative for cough and wheezing.   Cardiovascular: Positive for chest pain. Negative for palpitations and syncope.  Gastrointestinal: Negative for nausea, vomiting and abdominal pain.  Genitourinary: Negative for dysuria.  Musculoskeletal: Negative for back pain.  Skin: Negative for rash.  Neurological: Negative for dizziness and headaches.  Hematological: Does not bruise/bleed easily.    Allergies  Cleocin; Demerol; Erythromycin; Meperidine hcl; and Penicillins  Home Medications   Current Outpatient Rx  Name Route Sig Dispense Refill  . ALPRAZOLAM 0.25 MG PO TABS Oral Take 0.5 mg by mouth 2 (two) times daily.     . AMIODARONE HCL 200 MG PO TABS Oral Take 200 mg by mouth daily.     Marland Kitchen DOCUSATE SODIUM 100 MG PO CAPS Oral Take 100 mg by mouth every morning.    Marland Kitchen FLUPHENAZINE HCL 2.5 MG PO TABS Oral Take 2.5 mg by mouth every other day.    Marland Kitchen HYDROMORPHONE HCL ER 16 MG PO TB24 Oral Take 1 tablet by mouth daily.     Marland Kitchen LISINOPRIL 10 MG PO TABS  take 1 tablet by mouth once daily 30 tablet 11  . CEREFOLIN NAC 6-2-600 MG PO TABS Oral Take 1 tablet by mouth 2 (two) times daily.     . OXYCODONE-ACETAMINOPHEN 5-325 MG PO TABS Oral Take 1 tablet by mouth every 6 (six) hours  as needed. For breakthrough pain     . SENNOSIDES 8.6 MG PO TABS Oral Take 2 tablets by mouth at bedtime.     . TRAZODONE HCL 50 MG PO TABS Oral Take 150 mg by mouth at bedtime.     . VENLAFAXINE HCL ER 150 MG PO CP24 Oral Take 150 mg by mouth daily.    . WARFARIN SODIUM 5 MG PO TABS Oral Take 2.5-5 mg by mouth daily. Take 5 MG on Mondays, Wednesdays, and Fridays. Take 2.5 MG on Tuesdays, Thursdays, Saturdays, and Sundays.      BP 170/84  Pulse 54  Temp(Src) 98.1 F (36.7 C) (Oral)  Resp 16  SpO2 96%  Physical Exam  Nursing note and vitals reviewed. Constitutional: She is oriented to person, place, and time. She appears well-developed and well-nourished. No distress.  HENT:  Head: Normocephalic and atraumatic.    Mouth/Throat: Oropharynx is clear and moist.  Eyes: Conjunctivae and EOM are normal. Pupils are equal, round, and reactive to light.  Neck: Normal range of motion. Neck supple.  Cardiovascular: Normal rate and regular rhythm.   Murmur heard. Pulmonary/Chest: Effort normal and breath sounds normal. No respiratory distress. She has no wheezes. She has no rales. She exhibits no tenderness.  Abdominal: Soft. Bowel sounds are normal. There is no tenderness.  Musculoskeletal: Normal range of motion. She exhibits no edema and no tenderness.  Neurological: She is alert and oriented to person, place, and time. No cranial nerve deficit. She exhibits normal muscle tone. Coordination normal.  Skin: Skin is warm. No rash noted.    ED Course  Procedures (including critical care time)  Labs Reviewed  BASIC METABOLIC PANEL - Abnormal; Notable for the following:    GFR calc non Af Amer 83 (*)    All other components within normal limits  PROTIME-INR - Abnormal; Notable for the following:    Prothrombin Time 21.8 (*)    INR 1.86 (*)    All other components within normal limits  TROPONIN I  CBC  DIFFERENTIAL  TROPONIN I   Dg Chest 2 View  06/26/2011  *RADIOLOGY REPORT*  Clinical Data: Chest pain.  Shortness of breath.  CHEST - 2 VIEW  Comparison: Chest x-ray 11/08/2009.  Findings: Lungs appear mildly hyperexpanded with flattening of the hemidiaphragms and pruning of the pulmonary vasculature in the periphery, suggesting a background of mild COPD.  No focal airspace consolidation.  No definite pleural effusions.  No evidence of pulmonary edema.  Heart size is within normal limits. The patient is rotated to the right on today's exam, resulting in distortion of the mediastinal contours and reduced diagnostic sensitivity and specificity for mediastinal pathology.  Atherosclerotic calcifications are noted within the arch of the aorta.  IMPRESSION: 1.  Background of mild COPD redemonstrated, as above.  No  radiographic evidence of acute cardiopulmonary disease. 2. Extensive atherosclerosis.  Original Report Authenticated By: Florencia Reasons, M.D.    Date: 06/26/2011  Rate: 59  Rhythm: sinus bradycardia  QRS Axis: normal  Intervals: normal  ST/T Wave abnormalities: nonspecific ST/T changes  Conduction Disutrbances:none  Narrative Interpretation:   Old EKG Reviewed: unchanged    1. Chest pain    EKG without significant change from 12/02/2010   MDM    Patient with a single episode of chest pain at 2:00 this afternoon lasting 15-20 minutes associated with some mild shortness of breath no nausea no vomiting pain did radiate into the right substernal area and up into the jaw. This  has no stents history of A. fib no prior MI. Workup here with the troponin and cardiac markers 1 beyond 6 hours from onset of pain are negative EKG without any chest acute changes no significant change from November of 2012. Chest x-ray negative. Patient has been pain free since that episode at 2:00 serotonin she is followed by cardiology for the history of atrial fib she can followup with them later this week or next week.        Shelda Jakes, MD 06/26/11 2130

## 2011-06-26 NOTE — ED Notes (Signed)
PT has hx of afib and now with bilateral chest pain and jaw/mouth pain.  Pt is reporting nausea .

## 2011-06-27 ENCOUNTER — Telehealth: Payer: Self-pay | Admitting: Internal Medicine

## 2011-06-27 NOTE — Telephone Encounter (Signed)
New msg Pt's daughter wanted to talk to a nurse. She said her mother was in ER yesterday. Please call

## 2011-06-27 NOTE — Telephone Encounter (Signed)
Needs to be scheduled with the PA later this week

## 2011-06-30 ENCOUNTER — Ambulatory Visit: Payer: Medicare Other | Admitting: Cardiology

## 2011-07-03 ENCOUNTER — Ambulatory Visit (INDEPENDENT_AMBULATORY_CARE_PROVIDER_SITE_OTHER): Payer: Medicare Other | Admitting: Licensed Clinical Social Worker

## 2011-07-03 DIAGNOSIS — F4323 Adjustment disorder with mixed anxiety and depressed mood: Secondary | ICD-10-CM

## 2011-07-05 ENCOUNTER — Ambulatory Visit (INDEPENDENT_AMBULATORY_CARE_PROVIDER_SITE_OTHER): Payer: Medicare Other | Admitting: Internal Medicine

## 2011-07-05 ENCOUNTER — Encounter: Payer: Self-pay | Admitting: Internal Medicine

## 2011-07-05 VITALS — BP 128/64 | HR 54 | Resp 18 | Ht 64.0 in | Wt 141.1 lb

## 2011-07-05 DIAGNOSIS — I5032 Chronic diastolic (congestive) heart failure: Secondary | ICD-10-CM

## 2011-07-05 DIAGNOSIS — I498 Other specified cardiac arrhythmias: Secondary | ICD-10-CM

## 2011-07-05 DIAGNOSIS — I4891 Unspecified atrial fibrillation: Secondary | ICD-10-CM

## 2011-07-05 DIAGNOSIS — R079 Chest pain, unspecified: Secondary | ICD-10-CM

## 2011-07-05 MED ORDER — NITROGLYCERIN 0.4 MG SL SUBL: 0.4000 mg | SUBLINGUAL_TABLET | SUBLINGUAL | Status: DC | PRN

## 2011-07-05 MED ORDER — RIVAROXABAN 20 MG PO TABS: 20.0000 mg | ORAL_TABLET | Freq: Every day | ORAL | Status: DC

## 2011-07-05 NOTE — Patient Instructions (Addendum)
Your physician recommends that you schedule a follow-up appointment in: 4 weeks with Weston Brass, Pharm D and 6 months with Dr Johney Frame  Your physician has recommended you make the following change in your medication:  1) Stop Coumadin 2) Start Xarelto 20mg  daily 3) Prescription given for SL NTG---pt to keep with her  Your physician has requested that you have a lexiscan myoview. For further information please visit [email or web address] Please follow instruction sheet, as given.   Your physician recommends that you return for lab work today  Liver,TSH, T4, INR

## 2011-07-06 LAB — PROTIME-INR
INR: 2.1 ratio — ABNORMAL HIGH (ref 0.8–1.0)
Prothrombin Time: 22.7 s — ABNORMAL HIGH (ref 10.2–12.4)

## 2011-07-06 LAB — HEPATIC FUNCTION PANEL
Albumin: 3.8 g/dL (ref 3.5–5.2)
Alkaline Phosphatase: 67 U/L (ref 39–117)
Total Protein: 6.7 g/dL (ref 6.0–8.3)

## 2011-07-06 LAB — TSH: TSH: 0.57 u[IU]/mL (ref 0.35–5.50)

## 2011-07-06 LAB — T4, FREE: Free T4: 0.96 ng/dL (ref 0.60–1.60)

## 2011-07-07 ENCOUNTER — Ambulatory Visit: Payer: Self-pay | Admitting: Cardiology

## 2011-07-07 DIAGNOSIS — I4891 Unspecified atrial fibrillation: Secondary | ICD-10-CM

## 2011-07-07 DIAGNOSIS — I059 Rheumatic mitral valve disease, unspecified: Secondary | ICD-10-CM

## 2011-07-16 DIAGNOSIS — R079 Chest pain, unspecified: Secondary | ICD-10-CM | POA: Insufficient documentation

## 2011-07-16 NOTE — Assessment & Plan Note (Signed)
She has recently developed chest pain I will proceed with lexiscan myoview.  We will plan medical therapy unless high risk.

## 2011-07-16 NOTE — Assessment & Plan Note (Signed)
Asymptomatic  No changes today 

## 2011-07-16 NOTE — Assessment & Plan Note (Signed)
euvolemic No changes 

## 2011-07-16 NOTE — Progress Notes (Signed)
The patient presents today for routine electrophysiology followup.  Since her recent cardioversion, she reports doing reasonably well.  She reports having some left sided chest pain over the past 2-3 weeks.  This occurs at rest.  She is unaware of exertional symptoms.   Today, she denies symptoms of palpitations,   orthopnea, PND, lower extremity edema, dizziness, presyncope, syncope, or neurologic sequela.  The patient feels that she is tolerating medications without difficulties and is otherwise without complaint today.   Past Medical History  Diagnosis Date  . Persistent atrial fibrillation   . Sinus bradycardia   . Edema   . DDD (degenerative disc disease)   . HTN (hypertension)   . Depression, reactive, psychotic   . Mitral regurgitation    Past Surgical History  Procedure Date  . Laminectomy   . Cesarean section   . Cardioversion 12/02/2010    Procedure: CARDIOVERSION;  Surgeon: Marca Ancona, MD;  Location: [facility] OR;  Service: Cardiovascular;  Laterality: N/A;  OUTPATIENT CARDIOVERSION    Current outpatient prescriptions:ALPRAZolam (XANAX) 0.25 MG tablet, Take 0.5 mg by mouth 2 (two) times daily. , Disp: , Rfl: ;  amiodarone (PACERONE) 200 MG tablet, Take 200 mg by mouth daily. , Disp: , Rfl: ;  docusate sodium (COLACE) 100 MG capsule, Take 100 mg by mouth every morning., Disp: , Rfl: ;  fluPHENAZine (PROLIXIN) 2.5 MG tablet, Take 2.5 mg by mouth every other day., Disp: , Rfl:  HYDROmorphone HCl (EXALGO) 16 MG TB24, Take 1 tablet by mouth daily. , Disp: , Rfl: ;  lisinopril (PRINIVIL,ZESTRIL) 10 MG tablet, take 1 tablet by mouth once daily, Disp: 30 tablet, Rfl: 11;  Methylfol-Methylcob-Acetylcyst (CEREFOLIN NAC) 6-2-600 MG TABS, Take 1 tablet by mouth 2 (two) times daily. , Disp: , Rfl:  oxyCODONE-acetaminophen (PERCOCET) 5-325 MG per tablet, Take 1 tablet by mouth every 6 (six) hours as needed. For breakthrough pain , Disp: , Rfl: ;  senna (SENOKOT) 8.6 MG tablet, Take 2 tablets by mouth  at bedtime. , Disp: , Rfl: ;  traZODone (DESYREL) 50 MG tablet, Take 150 mg by mouth at bedtime. , Disp: , Rfl: ;  venlafaxine XR (EFFEXOR-XR) 150 MG 24 hr capsule, Take 150 mg by mouth daily., Disp: , Rfl:  nitroGLYCERIN (NITROSTAT) 0.4 MG SL tablet, Place 1 tablet (0.4 mg total) under the tongue every 5 (five) minutes as needed for chest pain., Disp: 90 tablet, Rfl: 3;  Rivaroxaban (XARELTO) 20 MG TABS, Take 20 mg by mouth daily., Disp: 30 tablet, Rfl: 6  Allergies  Allergen Reactions  . Cleocin (Clindamycin Hcl)   . Demerol   . Erythromycin   . Meperidine Hcl   . Penicillins     History   Social History  . Marital Status: Married    Spouse Name: N/A    Number of Children: N/A  . Years of Education: N/A   Occupational History  . Not on file.   Social History Main Topics  . Smoking status: Former Games developer  . Smokeless tobacco: Not on file  . Alcohol Use: Yes     none  . Drug Use: No  . Sexually Active: Not on file   Other Topics Concern  . Not on file   Social History Narrative  . No narrative on file    Family History  Problem Relation Age of Onset  . Arrhythmia Neg Hx     Physical Exam: Filed Vitals:   07/05/11 1456  BP: 128/64  Pulse: 54  Resp: 18  Height: 5\' 4"  (1.626 m)  Weight: 141 lb 1.9 oz (64.012 kg)    GEN- The patient is well appearing, alert and oriented x 3 today.   Head- normocephalic, atraumatic Eyes-  Sclera clear, conjunctiva pink Ears- hearing intact Oropharynx- clear Neck- supple, no JVP Lymph- no cervical lymphadenopathy Lungs- Clear to ausculation bilaterally, normal work of breathing Heart- regular rate and rhythm, 2/6 SEM LUSB GI- soft, NT, ND, + BS Extremities- no clubbing, cyanosis, or edema MS- walks slowly with a walker Skin- no rash or lesion Psych- euthymic mood, full affect Neuro- strength and sensation are intact  ekg today reveals sinus rhythm 54 bpm, poor R wave progression, Qtc 434

## 2011-07-16 NOTE — Assessment & Plan Note (Signed)
Maintaining sinus rhythm Per her request, we will stop coumadin and start xarelto. She will return to anticoagulation clinic in 4-6 weeks for repeat BMET and CBC

## 2011-07-24 ENCOUNTER — Ambulatory Visit (HOSPITAL_COMMUNITY): Payer: Medicare HMO | Attending: Cardiology | Admitting: Radiology

## 2011-07-24 VITALS — BP 169/73 | Ht 64.0 in | Wt 135.0 lb

## 2011-07-24 DIAGNOSIS — R079 Chest pain, unspecified: Secondary | ICD-10-CM

## 2011-07-24 DIAGNOSIS — R0609 Other forms of dyspnea: Secondary | ICD-10-CM | POA: Insufficient documentation

## 2011-07-24 DIAGNOSIS — J449 Chronic obstructive pulmonary disease, unspecified: Secondary | ICD-10-CM | POA: Insufficient documentation

## 2011-07-24 DIAGNOSIS — J4489 Other specified chronic obstructive pulmonary disease: Secondary | ICD-10-CM | POA: Insufficient documentation

## 2011-07-24 DIAGNOSIS — I10 Essential (primary) hypertension: Secondary | ICD-10-CM | POA: Insufficient documentation

## 2011-07-24 DIAGNOSIS — I4891 Unspecified atrial fibrillation: Secondary | ICD-10-CM

## 2011-07-24 DIAGNOSIS — R0989 Other specified symptoms and signs involving the circulatory and respiratory systems: Secondary | ICD-10-CM | POA: Insufficient documentation

## 2011-07-24 DIAGNOSIS — R0602 Shortness of breath: Secondary | ICD-10-CM | POA: Insufficient documentation

## 2011-07-24 DIAGNOSIS — Z87891 Personal history of nicotine dependence: Secondary | ICD-10-CM | POA: Insufficient documentation

## 2011-07-24 MED ORDER — TECHNETIUM TC 99M TETROFOSMIN IV KIT
30.0000 | PACK | Freq: Once | INTRAVENOUS | Status: AC | PRN
  Administered 2011-07-24: 30 via INTRAVENOUS

## 2011-07-24 MED ORDER — REGADENOSON 0.4 MG/5ML IV SOLN
0.4000 mg | Freq: Once | INTRAVENOUS | Status: AC
  Administered 2011-07-24: 0.4 mg via INTRAVENOUS

## 2011-07-24 MED ORDER — TECHNETIUM TC 99M TETROFOSMIN IV KIT
10.0000 | PACK | Freq: Once | INTRAVENOUS | Status: AC | PRN
  Administered 2011-07-24: 10 via INTRAVENOUS

## 2011-07-24 NOTE — Progress Notes (Signed)
[facility] SITE 3 NUCLEAR MED [address] [place] [address] [phone]  Cardiology Nuclear Med Study  Kathryn Smith is a 78 y.o. female     MRN : [id number]     DOB: 1932-11-22  Procedure Date: 07/24/2011  Nuclear Med Background Indication for Stress Test:  Evaluation for Ischemia and Post Hospital@ [facility] on 06/26/11 with Chest pain,(-) enzymes and NS ST changes  History:  COPD and AFIB, 04/22/10: ECHO: 60-65% mild LVH, 12/02/10: Cardioversion Cardiac Risk Factors: History of Smoking and Hypertension  Symptoms:  Chest Pain, DOE and SOB   Nuclear Pre-Procedure Caffeine/Decaff Intake:  None NPO After: 7:00pm   Lungs:  clear O2 Sat: 95% on room air. IV 0.9% NS with Angio Cath:  22g  IV Site: R Hand  IV Started by:  Cathlyn Parsons, RN  Chest Size (in):  36 Cup Size: A  Height: 5\' 4"  (1.626 m)  Weight:  135 lb (61.236 kg)  BMI:  Body mass index is 23.17 kg/(m^2). Tech Comments:  n/a    Nuclear Med Study 1 or 2 day study: 1 day  Stress Test Type:  Lexiscan  Reading MD: Olga Millers, MD  Order Authorizing Provider:  Jarold Song  Resting Radionuclide: Technetium 63m Tetrofosmin  Resting Radionuclide Dose: 10.8 mCi   Stress Radionuclide:  Technetium 59m Tetrofosmin  Stress Radionuclide Dose: 32.9 mCi           Stress Protocol Rest HR: 51 Stress HR: 68  Rest BP: 169/73 Stress BP: 139/61  Exercise Time (min): n/a METS: n/a   Predicted Max HR: 143 bpm % Max HR: 118.18 bpm Rate Pressure Product: 27253   Dose of Adenosine (mg):  n/a Dose of Lexiscan: 0.4 mg  Dose of Atropine (mg): n/a Dose of Dobutamine: n/a mcg/kg/min (at max HR)  Stress Test Technologist: Milana Na, EMT-P  Nuclear Technologist:  Domenic Polite, CNMT     Rest Procedure:  Myocardial perfusion imaging was performed at rest 45 minutes following the intravenous administration of Technetium 86m Tetrofosmin. Rest ECG: NSR - Normal EKG  Stress Procedure:  The patient received IV  Lexiscan 0.4 mg over 15-seconds.  Technetium 29m Tetrofosmin injected at 30-seconds.  There were no significant changes, + sob, and chest pressure with Lexiscan.  Quantitative spect images were obtained after a 45 minute delay. Stress ECG: No significant change from baseline ECG  QPS Raw Data Images:  Acquisition technically good; normal left ventricular size. Stress Images:  There is decreased uptake in the distal anterior wall. Rest Images:  There is decreased uptake in the distal anterior wall. Subtraction (SDS):  No evidence of ischemia. Transient Ischemic Dilatation (Normal <1.22):  1.03 Lung/Heart Ratio (Normal <0.45):  0.25  Quantitative Gated Spect Images QGS EDV:  101 ml QGS ESV:  35 ml  Impression Exercise Capacity:  Lexiscan with no exercise. BP Response:  Normal blood pressure response. Clinical Symptoms:  There is chest pain and dyspnea. ECG Impression:  No significant ST segment change suggestive of ischemia. Comparison with Prior Nuclear Study: No images to compare  Overall Impression:  Probably normal stress nuclear study with a small, mild, fixed distal anterior defect suggestive of soft tissue attenuation but no ischemia.  LV Ejection Fraction: 65%.  LV Wall Motion:  NL LV Function; NL Wall Motion    Olga Millers

## 2011-07-25 ENCOUNTER — Other Ambulatory Visit: Payer: Self-pay | Admitting: *Deleted

## 2011-07-25 ENCOUNTER — Telehealth (HOSPITAL_COMMUNITY): Payer: Self-pay | Admitting: *Deleted

## 2011-07-25 MED ORDER — LISINOPRIL 10 MG PO TABS: 10.0000 mg | ORAL_TABLET | Freq: Every day | ORAL | Status: DC

## 2011-07-25 NOTE — Telephone Encounter (Signed)
Patient had nuclear stress test 07/24/11. She called today and asked for me(I performed portion of her test). She developed nausea and not feeling good today. She wanted to know if the myoview or lexiscan could have caused this.  I discussed with her that neither should have caused this.  Informed her to try to intake some caffeine products.  She also said she had chest pain this am but relieved with NTG. In researching her test results from Nuclear test,the myoview was read as normal. Did not discuss results with patient. Forwarded this message to [employer]. Cindy Jasmia Angst,RN

## 2011-07-26 ENCOUNTER — Telehealth: Payer: Self-pay | Admitting: Internal Medicine

## 2011-07-26 NOTE — Telephone Encounter (Signed)
New msg Pt wants to know test results from [facility]

## 2011-07-26 NOTE — Telephone Encounter (Signed)
Spoke with pt, aware of nuclear results 

## 2011-08-04 ENCOUNTER — Ambulatory Visit (INDEPENDENT_AMBULATORY_CARE_PROVIDER_SITE_OTHER): Payer: Medicare Other | Admitting: Licensed Clinical Social Worker

## 2011-08-04 DIAGNOSIS — F4323 Adjustment disorder with mixed anxiety and depressed mood: Secondary | ICD-10-CM

## 2011-08-11 ENCOUNTER — Ambulatory Visit (INDEPENDENT_AMBULATORY_CARE_PROVIDER_SITE_OTHER): Payer: Medicare Other

## 2011-08-11 DIAGNOSIS — I4891 Unspecified atrial fibrillation: Secondary | ICD-10-CM

## 2011-08-11 DIAGNOSIS — Z79899 Other long term (current) drug therapy: Secondary | ICD-10-CM

## 2011-08-11 LAB — CBC
HCT: 35.9 % — ABNORMAL LOW (ref 36.0–46.0)
Hemoglobin: 11.6 g/dL — ABNORMAL LOW (ref 12.0–15.0)
MCV: 93 fl (ref 78.0–100.0)
Platelets: 194 10*3/uL (ref 150.0–400.0)
RBC: 3.86 Mil/uL — ABNORMAL LOW (ref 3.87–5.11)
WBC: 4.5 10*3/uL (ref 4.5–10.5)

## 2011-08-11 LAB — BASIC METABOLIC PANEL
CO2: 30 mEq/L (ref 19–32)
Chloride: 105 mEq/L (ref 96–112)
Creatinine, Ser: 0.8 mg/dL (ref 0.4–1.2)
Potassium: 4.1 mEq/L (ref 3.5–5.1)
Sodium: 140 mEq/L (ref 135–145)

## 2011-08-11 NOTE — Patient Instructions (Addendum)
A full discussion of the nature of anticoagulants has been carried out.  A benefit/risk analysis has been presented to the patient, so that they understand the justification for choosing anticoagulation with Xarelto at this time.  The need for compliance is stressed.  Pt is aware to take the medication once daily with the largest meal of the day.  Side effects of potential bleeding are discussed, including unusual colored urine or stools, coughing up blood or coffee ground emesis, nose bleeds or serious fall or head trauma.  Discussed signs and symptoms of stroke. The patient should avoid any OTC items containing aspirin or ibuprofen.  Avoid alcohol consumption.   Call if any signs of abnormal bleeding.  Discussed financial obligations and resolved any difficulty in obtaining medication.  Next lab test test in 6 months.   We have checked a Complete Blood Count and BMP for kidney function at today's visit to insure dosing accuracy.   Continue on Xarelto 20mg  once daily with evening meal.

## 2011-08-11 NOTE — Progress Notes (Signed)
Pt was started on Xarelto for atrial fibrillation on 07/05/11 by Dr Johney Frame.  Reviewed patients medication list.  Pt is not currently on any combined P-gp and strong CYP3A4 inhibitors/inducers (ketoconazole, traconazole, ritonavir, carbamazepine, phenytoin, rifampin, St. John's wort).  Reviewed labs.  SCr 0.67, Weight 64kg, CrCl-71.04 .  Dose appropriate based on CrCl.   Hgb and HCT WNL on 06/26/11.  A full discussion of the nature of anticoagulants has been carried out.  A benefit/risk analysis has been presented to the patient, so that they understand the justification for choosing anticoagulation with Xarelto at this time.  The need for compliance is stressed.  Pt is aware to take the medication once daily with the largest meal of the day.  Side effects of potential bleeding are discussed, including unusual colored urine or stools, coughing up blood or coffee ground emesis, nose bleeds or serious fall or head trauma.  Discussed signs and symptoms of stroke. The patient should avoid any OTC items containing aspirin or ibuprofen.  Avoid alcohol consumption.   Call if any signs of abnormal bleeding.  Discussed financial obligations and resolved any difficulty in obtaining medication.  Next lab test test in 6 months.

## 2011-08-21 ENCOUNTER — Ambulatory Visit (INDEPENDENT_AMBULATORY_CARE_PROVIDER_SITE_OTHER): Payer: Medicare HMO | Admitting: Licensed Clinical Social Worker

## 2011-08-21 DIAGNOSIS — F4323 Adjustment disorder with mixed anxiety and depressed mood: Secondary | ICD-10-CM

## 2011-08-23 ENCOUNTER — Ambulatory Visit: Payer: Medicare Other | Admitting: Internal Medicine

## 2011-09-18 ENCOUNTER — Ambulatory Visit (INDEPENDENT_AMBULATORY_CARE_PROVIDER_SITE_OTHER): Payer: Medicare HMO | Admitting: Licensed Clinical Social Worker

## 2011-09-18 DIAGNOSIS — F4323 Adjustment disorder with mixed anxiety and depressed mood: Secondary | ICD-10-CM

## 2011-10-23 ENCOUNTER — Ambulatory Visit (INDEPENDENT_AMBULATORY_CARE_PROVIDER_SITE_OTHER): Payer: Medicare HMO | Admitting: Licensed Clinical Social Worker

## 2011-10-23 DIAGNOSIS — F4323 Adjustment disorder with mixed anxiety and depressed mood: Secondary | ICD-10-CM

## 2011-11-20 ENCOUNTER — Ambulatory Visit: Payer: Medicare HMO | Admitting: Licensed Clinical Social Worker

## 2011-11-22 ENCOUNTER — Ambulatory Visit (INDEPENDENT_AMBULATORY_CARE_PROVIDER_SITE_OTHER): Payer: Medicare HMO | Admitting: Licensed Clinical Social Worker

## 2011-11-22 DIAGNOSIS — F4323 Adjustment disorder with mixed anxiety and depressed mood: Secondary | ICD-10-CM

## 2011-12-20 ENCOUNTER — Ambulatory Visit (INDEPENDENT_AMBULATORY_CARE_PROVIDER_SITE_OTHER): Payer: Medicare HMO | Admitting: Licensed Clinical Social Worker

## 2011-12-20 DIAGNOSIS — F4323 Adjustment disorder with mixed anxiety and depressed mood: Secondary | ICD-10-CM

## 2012-01-08 ENCOUNTER — Ambulatory Visit (INDEPENDENT_AMBULATORY_CARE_PROVIDER_SITE_OTHER): Payer: Medicare HMO | Admitting: Internal Medicine

## 2012-01-08 ENCOUNTER — Encounter: Payer: Self-pay | Admitting: Internal Medicine

## 2012-01-08 VITALS — BP 140/78 | HR 58 | Ht 64.0 in | Wt 144.0 lb

## 2012-01-08 DIAGNOSIS — I4891 Unspecified atrial fibrillation: Secondary | ICD-10-CM

## 2012-01-08 DIAGNOSIS — I5032 Chronic diastolic (congestive) heart failure: Secondary | ICD-10-CM

## 2012-01-08 MED ORDER — RIVAROXABAN 20 MG PO TABS: 20.0000 mg | ORAL_TABLET | Freq: Every day | ORAL | Status: DC

## 2012-01-08 MED ORDER — LISINOPRIL 10 MG PO TABS: 10.0000 mg | ORAL_TABLET | Freq: Every day | ORAL | Status: DC

## 2012-01-08 MED ORDER — AMIODARONE HCL 200 MG PO TABS: 200.0000 mg | ORAL_TABLET | Freq: Every day | ORAL | Status: DC

## 2012-01-08 NOTE — Progress Notes (Signed)
The patient presents today for routine electrophysiology followup.  She is maintaining sinus rhythm and without complaint today.  Today, she denies symptoms of palpitations, exertional CP, SOB above baseline, orthopnea, PND, lower extremity edema, dizziness, presyncope, syncope, or neurologic sequela.  The patient feels that she is tolerating medications without difficulties and is otherwise without complaint today.   Past Medical History  Diagnosis Date  . Persistent atrial fibrillation   . Sinus bradycardia   . Edema   . DDD (degenerative disc disease)   . HTN (hypertension)   . Depression, reactive, psychotic   . Mitral regurgitation    Past Surgical History  Procedure Date  . Laminectomy   . Cesarean section   . Cardioversion 12/02/2010    Procedure: CARDIOVERSION;  Surgeon: Marca Ancona, MD;  Location: [facility] OR;  Service: Cardiovascular;  Laterality: N/A;  OUTPATIENT CARDIOVERSION    Current outpatient prescriptions:ALPRAZolam (XANAX) 0.25 MG tablet, Take 0.25 mg by mouth 2 (two) times daily. , Disp: , Rfl: ;  amiodarone (PACERONE) 200 MG tablet, Take 1 tablet (200 mg total) by mouth daily., Disp: 90 tablet, Rfl: 3;  docusate sodium (COLACE) 100 MG capsule, Take 100 mg by mouth every morning., Disp: , Rfl: ;  fluPHENAZine (PROLIXIN) 2.5 MG tablet, Take 2.5 mg by mouth every other day., Disp: , Rfl:  HYDROmorphone HCl (EXALGO) 12 MG T24A, Take 12 mg by mouth daily., Disp: , Rfl: ;  lidocaine (LIDODERM) 5 %, Place 1 patch onto the skin as needed. Remove & Discard patch within 12 hours or as directed by MD, Disp: , Rfl: ;  lisinopril (PRINIVIL,ZESTRIL) 10 MG tablet, Take 1 tablet (10 mg total) by mouth daily., Disp: 90 tablet, Rfl: 3 Methylfol-Methylcob-Acetylcyst (CEREFOLIN NAC) 6-2-600 MG TABS, Take 1 tablet by mouth 2 (two) times daily. , Disp: , Rfl: ;  nitroGLYCERIN (NITROSTAT) 0.4 MG SL tablet, Place 1 tablet (0.4 mg total) under the tongue every 5 (five) minutes as needed for chest pain.,  Disp: 90 tablet, Rfl: 3;  oxyCODONE-acetaminophen (PERCOCET) 5-325 MG per tablet, Take 1 tablet by mouth every 6 (six) hours as needed. For breakthrough pain , Disp: , Rfl:  Rivaroxaban (XARELTO) 20 MG TABS, Take 1 tablet (20 mg total) by mouth daily., Disp: 90 tablet, Rfl: 3;  senna (SENOKOT) 8.6 MG tablet, Take 2 tablets by mouth at bedtime. , Disp: , Rfl: ;  traZODone (DESYREL) 50 MG tablet, Take 150 mg by mouth at bedtime. , Disp: , Rfl: ;  venlafaxine XR (EFFEXOR-XR) 150 MG 24 hr capsule, Take 150 mg by mouth daily., Disp: , Rfl:   Allergies  Allergen Reactions  . Cleocin (Clindamycin Hcl)   . Demerol   . Erythromycin   . Meperidine Hcl   . Penicillins     History   Social History  . Marital Status: Married    Spouse Name: N/A    Number of Children: N/A  . Years of Education: N/A   Occupational History  . Not on file.   Social History Main Topics  . Smoking status: Former Games developer  . Smokeless tobacco: Not on file  . Alcohol Use: Yes     Comment: none  . Drug Use: No  . Sexually Active: Not on file   Other Topics Concern  . Not on file   Social History Narrative  . No narrative on file    Family History  Problem Relation Age of Onset  . Arrhythmia Neg Hx     Physical Exam: Filed Vitals:  01/08/12 1521  BP: 140/78  Pulse: 58  Height: 5\' 4"  (1.626 m)  Weight: 144 lb (65.318 kg)    GEN- The patient is well appearing, alert and oriented x 3 today.   Head- normocephalic, atraumatic Eyes-  Sclera clear, conjunctiva pink Ears- hearing intact Oropharynx- clear Neck- supple, no JVP Lymph- no cervical lymphadenopathy Lungs- Clear to ausculation bilaterally, normal work of breathing Heart- regular rate and rhythm, 2/6 SEM LUSB GI- soft, NT, ND, + BS Extremities- no clubbing, cyanosis, or edema MS- walks slowly with a walker Skin- no rash or lesion Psych- euthymic mood, full affect Neuro- strength and sensation are intact  ekg today reveals sinus rhythm 58  bpm, PR 218 msec, otherwise normal ekg myoview reviewed with the patient today  FIBRILLATION, ATRIAL  Maintaining sinus rhythm  Continue xarelto Check CBC, BMET, LFTs, and TFTs Check amiodarone level.  May consider decreasing amiodarone to 100mg  daily long term.  CHRONIC DIASTOLIC HEART FAILURE  euvolemic  No changes BMET today  Return in 6 months

## 2012-01-08 NOTE — Patient Instructions (Addendum)
Your physician wants you to follow-up in: 6 months with Dr Jacquiline Doe will receive a reminder letter in the mail two months in advance. If you don't receive a letter, please call our office to schedule the follow-up appointment.   Your physician recommends that you return for lab work today

## 2012-01-09 LAB — HEPATIC FUNCTION PANEL
AST: 18 U/L (ref 0–37)
Albumin: 3.9 g/dL (ref 3.5–5.2)
Alkaline Phosphatase: 56 U/L (ref 39–117)
Total Protein: 6.8 g/dL (ref 6.0–8.3)

## 2012-01-09 LAB — BASIC METABOLIC PANEL
BUN: 13 mg/dL (ref 6–23)
CO2: 29 mEq/L (ref 19–32)
Calcium: 8.6 mg/dL (ref 8.4–10.5)
GFR: 93.79 mL/min (ref >60.00)
Glucose, Bld: 88 mg/dL (ref 70–99)
Sodium: 133 mEq/L — ABNORMAL LOW (ref 135–145)

## 2012-01-09 LAB — CBC WITH DIFFERENTIAL/PLATELET
Basophils Absolute: 0 10*3/uL (ref 0.0–0.1)
Eosinophils Relative: 3.4 % (ref 0.0–5.0)
HCT: 32.9 % — ABNORMAL LOW (ref 36.0–46.0)
Hemoglobin: 10.8 g/dL — ABNORMAL LOW (ref 12.0–15.0)
Lymphocytes Relative: 30.8 % (ref 12.0–46.0)
Monocytes Relative: 10.6 % (ref 3.0–12.0)
Platelets: 209 10*3/uL (ref 150.0–400.0)
RDW: 15.1 % — ABNORMAL HIGH (ref 11.5–14.6)
WBC: 5.4 10*3/uL (ref 4.5–10.5)

## 2012-01-09 LAB — TSH: TSH: 0.85 u[IU]/mL (ref 0.35–5.50)

## 2012-01-10 LAB — AMIODARONE LEVEL
Amiodarone Lvl: 0.7 ug/mL — ABNORMAL LOW (ref 1.5–2.5)
Desethylamiodarone: 0.7 ug/mL — ABNORMAL LOW (ref 1.5–2.5)

## 2012-01-23 ENCOUNTER — Telehealth: Payer: Self-pay | Admitting: Internal Medicine

## 2012-01-23 NOTE — Telephone Encounter (Signed)
New Problem:    Patient's daughter called in wanting her mother's latest labs faxed to Dr. Gen[facility]urn [facility] fax: [phone].

## 2012-01-23 NOTE — Telephone Encounter (Signed)
Labs sent

## 2012-09-06 ENCOUNTER — Telehealth: Payer: Self-pay | Admitting: Neurology

## 2012-09-09 ENCOUNTER — Ambulatory Visit: Payer: Medicare HMO | Admitting: Neurology

## 2012-09-27 ENCOUNTER — Ambulatory Visit: Payer: Medicare HMO | Admitting: Neurology

## 2012-10-01 ENCOUNTER — Ambulatory Visit: Payer: Medicare HMO | Admitting: Neurology

## 2012-12-23 ENCOUNTER — Ambulatory Visit (INDEPENDENT_AMBULATORY_CARE_PROVIDER_SITE_OTHER): Payer: Medicare HMO | Admitting: Internal Medicine

## 2012-12-23 ENCOUNTER — Encounter: Payer: Self-pay | Admitting: Internal Medicine

## 2012-12-23 VITALS — BP 124/52 | HR 62 | Ht 64.0 in | Wt 150.4 lb

## 2012-12-23 DIAGNOSIS — I5032 Chronic diastolic (congestive) heart failure: Secondary | ICD-10-CM

## 2012-12-23 DIAGNOSIS — R079 Chest pain, unspecified: Secondary | ICD-10-CM

## 2012-12-23 DIAGNOSIS — I4891 Unspecified atrial fibrillation: Secondary | ICD-10-CM

## 2012-12-23 MED ORDER — AMIODARONE HCL 200 MG PO TABS: 100.0000 mg | ORAL_TABLET | Freq: Every day | ORAL | Status: DC

## 2012-12-23 NOTE — Progress Notes (Signed)
The patient presents today for routine electrophysiology followup.  She is maintaining sinus rhythm and without complaint today.  She had chest pain which woke her from sleep 12/06/12.  She has had none since.  She remains active in the facility where she lives.  She has a resting tremor chronically.  Today, she denies symptoms of palpitations, exertional CP, SOB above baseline, orthopnea, PND, lower extremity edema, dizziness, presyncope, syncope, or neurologic sequela.  The patient feels that she is tolerating medications without difficulties and is otherwise without complaint today.   Past Medical History  Diagnosis Date  . Persistent atrial fibrillation   . Sinus bradycardia   . Edema   . DDD (degenerative disc disease)   . HTN (hypertension)   . Depression, reactive, psychotic   . Mitral regurgitation    Past Surgical History  Procedure Laterality Date  . Laminectomy    . Cesarean section    . Cardioversion  12/02/2010    Procedure: CARDIOVERSION;  Surgeon: Marca Ancona, MD;  Location: [facility] OR;  Service: Cardiovascular;  Laterality: N/A;  OUTPATIENT CARDIOVERSION    Current outpatient prescriptions:ALPRAZolam (XANAX) 0.25 MG tablet, Take 0.25 mg by mouth at bedtime. , Disp: , Rfl: ;  amiodarone (PACERONE) 200 MG tablet, Take 1 tablet (200 mg total) by mouth daily., Disp: 90 tablet, Rfl: 3;  docusate sodium (COLACE) 100 MG capsule, Take 100 mg by mouth every morning., Disp: , Rfl: ;  fluPHENAZine (PROLIXIN) 2.5 MG tablet, Take 2.5 mg by mouth at bedtime. , Disp: , Rfl:  HYDROmorphone HCl (EXALGO) 8 MG T24A SR tablet, Take 8 mg by mouth daily., Disp: , Rfl: ;  iloperidone (FANAPT) 4 MG TABS tablet, Take 4 mg by mouth at bedtime., Disp: , Rfl: ;  lidocaine (LIDODERM) 5 %, Place 1 patch onto the skin as needed. Remove & Discard patch within 12 hours or as directed by MD, Disp: , Rfl: ;  lisinopril (PRINIVIL,ZESTRIL) 10 MG tablet, Take 1 tablet (10 mg total) by mouth daily., Disp: 90 tablet, Rfl:  3 Methylfol-Methylcob-Acetylcyst (CEREFOLIN NAC) 6-2-600 MG TABS, Take 1 tablet by mouth 2 (two) times daily. , Disp: , Rfl: ;  nitroGLYCERIN (NITROSTAT) 0.4 MG SL tablet, Place 1 tablet (0.4 mg total) under the tongue every 5 (five) minutes as needed for chest pain., Disp: 90 tablet, Rfl: 3;  NON FORMULARY, Take 2 tablets by mouth daily. Avocado supplement, Disp: , Rfl:  oxyCODONE-acetaminophen (PERCOCET) 5-325 MG per tablet, Take 1 tablet by mouth every 6 (six) hours as needed. For breakthrough pain, Disp: , Rfl: ;  polyethylene glycol (MIRALAX / GLYCOLAX) packet, Take 17 g by mouth daily as needed., Disp: , Rfl: ;  Rivaroxaban (XARELTO) 20 MG TABS, Take 1 tablet (20 mg total) by mouth daily., Disp: 90 tablet, Rfl: 3;  senna (SENOKOT) 8.6 MG tablet, Take 2 tablets by mouth at bedtime. , Disp: , Rfl:  tetrabenazine (XENAZINE) 12.5 MG tablet, Take 12.5 mg by mouth 3 (three) times daily., Disp: , Rfl: ;  traZODone (DESYREL) 50 MG tablet, Take 200 mg by mouth at bedtime. , Disp: , Rfl: ;  venlafaxine XR (EFFEXOR-XR) 150 MG 24 hr capsule, Take 150 mg by mouth daily., Disp: , Rfl:   Allergies  Allergen Reactions  . Cleocin [Clindamycin Hcl]   . Demerol   . Erythromycin   . Meperidine Hcl   . Penicillins     History   Social History  . Marital Status: Married    Spouse Name: N/A  Number of Children: N/A  . Years of Education: N/A   Occupational History  . Not on file.   Social History Main Topics  . Smoking status: Former Games developer  . Smokeless tobacco: Not on file  . Alcohol Use: Yes     Comment: none  . Drug Use: No  . Sexual Activity: Not on file   Other Topics Concern  . Not on file   Social History Narrative  . No narrative on file    Family History  Problem Relation Age of Onset  . Arrhythmia Neg Hx     Physical Exam: Filed Vitals:   12/23/12 1618  BP: 124/52  Pulse: 62  Height: 5\' 4"  (1.626 m)  Weight: 150 lb 6.4 oz (68.221 kg)    GEN- The patient is well  appearing, alert and oriented x 3 today.   Head- normocephalic, atraumatic Eyes-  Sclera clear, conjunctiva pink Ears- hearing intact Oropharynx- clear Neck- supple, no JVP Lymph- no cervical lymphadenopathy Lungs- Clear to ausculation bilaterally, normal work of breathing Heart- regular rate and rhythm, 2/6 SEM LUSB mid peaking GI- soft, NT, ND, + BS Extremities- no clubbing, cyanosis, or edema MS- walks slowly with a walker Skin- no rash or lesion Psych- euthymic mood, full affect Neuro- moderate resting tremor is noted  ekg today reveals sinus rhythm 62 bpm, PR 220 msec, anterior infarction myoview reviewed with the patient today  FIBRILLATION, ATRIAL  Maintaining sinus rhythm  Continue xarelto Check CBC, BMET, LFTs, and TFTs Decrease amiodarone to 100mg  daily  CHRONIC DIASTOLIC HEART FAILURE  euvolemic  No changes BMET today  Chest pain Given low risk myoview previously and advanced age, we will treat conservatively at this time If she has progressive chest pain then more aggressive therapies may be warranted at that time.  Return in 6 months to see Lawson Fiscal I will see in a year

## 2012-12-23 NOTE — Patient Instructions (Signed)
Your physician wants you to follow-up in: 6 months with Kathryn Smith and 12 months with Kathryn Smith will receive a reminder letter in the mail two months in advance. If you don't receive a letter, please call our office to schedule the follow-up appointment.  Your physician has recommended you make the following change in your medication:  1) Decrease Amiodarone to 100mg  daily  Your physician recommends that you return for lab work in: Liver/TSH/FreeT4/BMP/CBC

## 2012-12-24 ENCOUNTER — Telehealth: Payer: Self-pay | Admitting: *Deleted

## 2012-12-24 LAB — TSH: TSH: 0.82 u[IU]/mL (ref 0.35–5.50)

## 2012-12-24 LAB — BASIC METABOLIC PANEL
BUN: 18 mg/dL (ref 6–23)
CO2: 23 mEq/L (ref 19–32)
Calcium: 8.8 mg/dL (ref 8.4–10.5)
Chloride: 102 mEq/L (ref 96–112)
Creatinine, Ser: 0.7 mg/dL (ref 0.4–1.2)
Glucose, Bld: 91 mg/dL (ref 70–99)

## 2012-12-24 LAB — HEPATIC FUNCTION PANEL
ALT: 11 U/L (ref 0–35)
Albumin: 3.7 g/dL (ref 3.5–5.2)
Total Protein: 6.6 g/dL (ref 6.0–8.3)

## 2012-12-24 LAB — CBC WITH DIFFERENTIAL/PLATELET
Basophils Absolute: 0 10*3/uL (ref 0.0–0.1)
Basophils Relative: 0.1 % (ref 0.0–3.0)
Eosinophils Absolute: 0.3 10*3/uL (ref 0.0–0.7)
Eosinophils Relative: 5.2 % — ABNORMAL HIGH (ref 0.0–5.0)
Lymphocytes Relative: 21.6 % (ref 12.0–46.0)
Lymphs Abs: 1.2 10*3/uL (ref 0.7–4.0)
MCHC: 31.5 g/dL (ref 30.0–36.0)
MCV: 73.8 fl — ABNORMAL LOW (ref 78.0–100.0)
Monocytes Absolute: 0.7 10*3/uL (ref 0.1–1.0)
Neutrophils Relative %: 61.6 % (ref 43.0–77.0)
RDW: 17.3 % — ABNORMAL HIGH (ref 11.5–14.6)
WBC: 5.7 10*3/uL (ref 4.5–10.5)

## 2012-12-24 LAB — T4, FREE: Free T4: 0.9 ng/dL (ref 0.60–1.60)

## 2012-12-24 NOTE — Telephone Encounter (Signed)
Received call from [facility]. Lab with a report of blood work on this patient with a Hgb of 7.7 and Hct of 24.5. Called patient with results and she admits to feeling very tired but denies any active bleeding. The patient asked me to call her daughter Randa Evens with the details. Discussed with Dr.Nahser (DOD) and he advised that the patient follow up with PCP. Appointment made to see Dr.Ehinger (PCP) tomorrow at 230pm. Joanne,patient's daughter aware and will bring her to the visit. Lab work sent to Dr.Ehinger.

## 2012-12-26 ENCOUNTER — Telehealth: Payer: Self-pay | Admitting: Internal Medicine

## 2012-12-26 ENCOUNTER — Encounter (HOSPITAL_COMMUNITY): Payer: Self-pay | Admitting: Emergency Medicine

## 2012-12-26 ENCOUNTER — Observation Stay (HOSPITAL_COMMUNITY)
Admission: EM | Admit: 2012-12-26 | Discharge: 2012-12-26 | Disposition: A | Discharge status: Home / Self Care | Payer: Medicare HMO | Attending: Internal Medicine | Admitting: Internal Medicine

## 2012-12-26 DIAGNOSIS — Z79899 Other long term (current) drug therapy: Secondary | ICD-10-CM | POA: Insufficient documentation

## 2012-12-26 DIAGNOSIS — I10 Essential (primary) hypertension: Secondary | ICD-10-CM | POA: Diagnosis present

## 2012-12-26 DIAGNOSIS — F323 Major depressive disorder, single episode, severe with psychotic features: Secondary | ICD-10-CM | POA: Diagnosis present

## 2012-12-26 DIAGNOSIS — D649 Anemia, unspecified: Secondary | ICD-10-CM | POA: Diagnosis present

## 2012-12-26 DIAGNOSIS — Z7901 Long term (current) use of anticoagulants: Secondary | ICD-10-CM | POA: Insufficient documentation

## 2012-12-26 DIAGNOSIS — I4891 Unspecified atrial fibrillation: Secondary | ICD-10-CM | POA: Diagnosis present

## 2012-12-26 DIAGNOSIS — D509 Iron deficiency anemia, unspecified: Principal | ICD-10-CM | POA: Insufficient documentation

## 2012-12-26 DIAGNOSIS — G8929 Other chronic pain: Secondary | ICD-10-CM | POA: Diagnosis present

## 2012-12-26 DIAGNOSIS — I059 Rheumatic mitral valve disease, unspecified: Secondary | ICD-10-CM | POA: Insufficient documentation

## 2012-12-26 DIAGNOSIS — I5032 Chronic diastolic (congestive) heart failure: Secondary | ICD-10-CM

## 2012-12-26 DIAGNOSIS — G2401 Drug induced subacute dyskinesia: Secondary | ICD-10-CM | POA: Insufficient documentation

## 2012-12-26 DIAGNOSIS — F32A Depression, unspecified: Secondary | ICD-10-CM | POA: Insufficient documentation

## 2012-12-26 HISTORY — DX: Drug induced subacute dyskinesia: G24.01

## 2012-12-26 HISTORY — DX: Other chronic pain: G89.29

## 2012-12-26 LAB — CBC WITH DIFFERENTIAL/PLATELET
Basophils Relative: 0 % (ref 0–1)
Hemoglobin: 7.5 g/dL — ABNORMAL LOW (ref 12.0–15.0)
MCH: 23.1 pg — ABNORMAL LOW (ref 26.0–34.0)
MCHC: 30.2 g/dL (ref 30.0–36.0)
Monocytes Relative: 10 % (ref 3–12)
Neutro Abs: 3.2 10*3/uL (ref 1.7–7.7)
Neutrophils Relative %: 67 % (ref 43–77)
Platelets: 288 10*3/uL (ref 150–400)
RBC: 3.24 MIL/uL — ABNORMAL LOW (ref 3.87–5.11)

## 2012-12-26 LAB — BASIC METABOLIC PANEL
BUN: 11 mg/dL (ref 6–23)
Chloride: 97 mEq/L (ref 96–112)
GFR calc Af Amer: >90 mL/min (ref >90)
GFR calc non Af Amer: 83 mL/min — ABNORMAL LOW (ref >90)
Potassium: 4 mEq/L (ref 3.5–5.1)
Sodium: 132 mEq/L — ABNORMAL LOW (ref 135–145)

## 2012-12-26 LAB — RETICULOCYTES
RBC.: 3.23 MIL/uL — ABNORMAL LOW (ref 3.87–5.11)
Retic Count, Absolute: 42 10*3/uL (ref 19.0–186.0)

## 2012-12-26 LAB — HEMOGLOBIN AND HEMATOCRIT, BLOOD: Hemoglobin: 8.7 g/dL — ABNORMAL LOW (ref 12.0–15.0)

## 2012-12-26 LAB — ABO/RH: ABO/RH(D): O POS

## 2012-12-26 LAB — PROTIME-INR: INR: 0.99 (ref 0.00–1.49)

## 2012-12-26 MED ORDER — TETRABENAZINE 12.5 MG PO TABS: 12.5000 mg | ORAL_TABLET | Freq: Three times a day (TID) | ORAL | Status: DC

## 2012-12-26 MED ORDER — ILOPERIDONE 4 MG PO TABS
4.0000 mg | ORAL_TABLET | Freq: Every day | ORAL | Status: DC
  Administered 2012-12-26: 4 mg via ORAL
  Filled 2012-12-26 (×2): qty 1

## 2012-12-26 MED ORDER — TETRABENAZINE 12.5 MG PO TABS
12.5000 mg | ORAL_TABLET | Freq: Three times a day (TID) | ORAL | Status: DC
  Filled 2012-12-26: qty 1

## 2012-12-26 MED ORDER — POLYETHYLENE GLYCOL 3350 17 G PO PACK
17.0000 g | PACK | Freq: Every day | ORAL | Status: DC | PRN
  Filled 2012-12-26: qty 1

## 2012-12-26 MED ORDER — RIVAROXABAN 20 MG PO TABS
20.0000 mg | ORAL_TABLET | Freq: Every day | ORAL | Status: DC
  Administered 2012-12-26: 20 mg via ORAL
  Filled 2012-12-26 (×2): qty 1

## 2012-12-26 MED ORDER — SODIUM CHLORIDE 0.9 % IJ SOLN
3.0000 mL | Freq: Two times a day (BID) | INTRAMUSCULAR | Status: DC
  Administered 2012-12-26: via INTRAVENOUS

## 2012-12-26 MED ORDER — SODIUM CHLORIDE 0.9 % IV SOLN: 250.0000 mL | INTRAVENOUS | Status: DC | PRN

## 2012-12-26 MED ORDER — ALPRAZOLAM 0.25 MG PO TABS
0.2500 mg | ORAL_TABLET | Freq: Every day | ORAL | Status: DC
  Administered 2012-12-26: 0.25 mg via ORAL
  Filled 2012-12-26: qty 1

## 2012-12-26 MED ORDER — NITROGLYCERIN 0.4 MG SL SUBL: 0.4000 mg | SUBLINGUAL_TABLET | SUBLINGUAL | Status: DC | PRN

## 2012-12-26 MED ORDER — SODIUM CHLORIDE 0.9 % IJ SOLN: 3.0000 mL | INTRAMUSCULAR | Status: DC | PRN

## 2012-12-26 MED ORDER — LISINOPRIL 10 MG PO TABS: 10.0000 mg | ORAL_TABLET | Freq: Every day | ORAL | Status: DC

## 2012-12-26 MED ORDER — RIVAROXABAN 20 MG PO TABS
20.0000 mg | ORAL_TABLET | Freq: Every day | ORAL | Status: DC
  Filled 2012-12-26: qty 1

## 2012-12-26 MED ORDER — VENLAFAXINE HCL ER 150 MG PO CP24: 150.0000 mg | ORAL_CAPSULE | Freq: Every day | ORAL | Status: DC

## 2012-12-26 MED ORDER — AMIODARONE HCL 100 MG PO TABS: 100.0000 mg | ORAL_TABLET | Freq: Every day | ORAL | Status: DC

## 2012-12-26 MED ORDER — LIDOCAINE 5 % EX PTCH
1.0000 | MEDICATED_PATCH | Freq: Every day | CUTANEOUS | Status: DC | PRN
  Filled 2012-12-26: qty 1

## 2012-12-26 MED ORDER — OXYCODONE-ACETAMINOPHEN 5-325 MG PO TABS
1.0000 | ORAL_TABLET | Freq: Four times a day (QID) | ORAL | Status: DC | PRN
  Administered 2012-12-26 (×2): 1 via ORAL
  Filled 2012-12-26 (×2): qty 1

## 2012-12-26 MED ORDER — SENNOSIDES 8.6 MG PO TABS: 2.0000 | ORAL_TABLET | Freq: Every day | ORAL | Status: DC

## 2012-12-26 MED ORDER — TRAZODONE HCL 100 MG PO TABS
200.0000 mg | ORAL_TABLET | Freq: Every day | ORAL | Status: DC
  Administered 2012-12-26: 200 mg via ORAL
  Filled 2012-12-26 (×2): qty 2

## 2012-12-26 MED ORDER — DOCUSATE SODIUM 100 MG PO CAPS: 100.0000 mg | ORAL_CAPSULE | Freq: Every morning | ORAL | Status: DC

## 2012-12-26 MED ORDER — HYDROMORPHONE HCL ER 8 MG PO T24A: 8.0000 mg | EXTENDED_RELEASE_TABLET | Freq: Every day | ORAL | Status: DC

## 2012-12-26 MED ORDER — CEREFOLIN NAC 6-2-600 MG PO TABS: 1.0000 | ORAL_TABLET | Freq: Two times a day (BID) | ORAL | Status: DC

## 2012-12-26 MED ORDER — SENNA 8.6 MG PO TABS
2.0000 | ORAL_TABLET | Freq: Every day | ORAL | Status: DC
  Administered 2012-12-26: 17.2 mg via ORAL
  Filled 2012-12-26: qty 2

## 2012-12-26 MED ORDER — FLUPHENAZINE HCL 2.5 MG PO TABS
2.5000 mg | ORAL_TABLET | Freq: Every day | ORAL | Status: DC
  Administered 2012-12-26: 2.5 mg via ORAL
  Filled 2012-12-26 (×2): qty 1

## 2012-12-26 NOTE — ED Notes (Signed)
Patient saw the gastroenterologist today and was told to come to the ED for a blood transfusion with Hgb 7.7.

## 2012-12-26 NOTE — H&P (Signed)
[facility] History and Physical  [name] ZOX:[id number] DOB: 1932/01/31 DOA: 12/26/2012  Referring physician: Dr. Toy Cookey PCP: Thora Lance, MD  GI: Dr. Bosie Clos   Chief Complaint: Fatigue   History of Present Illness: Kathryn Smith is an 79 y.o. female with a PMH of atrial fibrillation on chronic anticoagulation with Xarelto, recent onset of anemia under the evaluation of Dr. Bosie Clos with plans for an outpatient colonoscopy and endoscopy scheduled for 01/02/2013, who presented to the ER at Dr. Marge Duncans direction to receive a blood transfusion. The patient complains of a three-week history of progressive fatigue but no complaints of dizziness, orthostatic hypotension, chest pain, or shortness of breath.  The patient's hemoglobin was noted to be 10.8 on 01/08/2012. On 12/23/2012, it had dropped to 7.7, and it was 7.5 on initial evaluation here today. The ER physician performed a fecal occult blood test which was negative, and the patient denies black or bloody stools.  Review of Systems: Constitutional: No fever, no chills;  Appetite normal; No weight loss, no weight gain, + fatigue x 3 weeks.  HEENT: No blurry vision, no diplopia, no pharyngitis, no dysphagia CV: +  chest pain on 12/06/12 for which she took 3 SL NTG, none since, no palpitations, no PND.  Resp: No SOB, no cough, no pleuritic pain. GI: No nausea, no vomiting, no diarrhea, no melena, no hematochezia, no constipation.  GU: No dysuria, no hematuria, + frequency, + urgency, + urinary incontinence MSK: no myalgias, + chronic arthralgias in the back.  Neuro:  No headache, no focal neurological deficits, no history of seizures.  Psych: + depression, no anxiety.  Endo: No heat intolerance, no cold intolerance, no polyuria, no polydipsia  Skin: No rashes, no skin lesions.  Heme: No easy bruising.  Travel history: None in the past 6 month.  Past Medical History Past Medical History  Diagnosis Date  . Persistent  atrial fibrillation   . Sinus bradycardia   . Edema   . DDD (degenerative disc disease)   . HTN (hypertension)   . Depression, reactive, psychotic   . Mitral regurgitation   . Chronic pain   . Tardive dyskinesia      Past Surgical History Past Surgical History  Procedure Laterality Date  . Laminectomy      x 2, 2nd one w/ fusion  . Cesarean section    . Cardioversion  12/02/2010    Procedure: CARDIOVERSION;  Surgeon: Marca Ancona, MD;  Location: [facility] OR;  Service: Cardiovascular;  Laterality: N/A;  OUTPATIENT CARDIOVERSION  . Cataract surgery      march and June 2014  . Hernia repair       Social History: History   Social History  . Marital Status: Married    Spouse Name: [name]    Number of Children: 2  . Years of Education: 16   Occupational History  . Retired Engineer, civil (consulting)    Social History Main Topics  . Smoking status: Former Smoker    Quit date: 12/26/2009  . Smokeless tobacco: Never Used  . Alcohol Use: No     Comment: None  . Drug Use: No  . Sexual Activity: Not on file   Other Topics Concern  . Not on file   Social History Narrative   Married.  Resident of [employer], Independent side.  Nurses do administer her medication.  Ambulates with a walker.  Meals are provided.    Family History:  Family History  Problem Relation Age of Onset  . Arrhythmia  Neg Hx   . Cancer Sister   . Alcoholism Father     Allergies: Cleocin; Demerol; Erythromycin; Meperidine hcl; and Penicillins  Meds: Prior to Admission medications   Medication Sig Start Date End Date Taking? Authorizing Provider  ALPRAZolam (XANAX) 0.25 MG tablet Take 0.25 mg by mouth at bedtime.    Yes Historical Provider, MD  amiodarone (PACERONE) 200 MG tablet Take 0.5 tablets (100 mg total) by mouth daily. 12/23/12  Yes [name], MD  docusate sodium (COLACE) 100 MG capsule Take 100 mg by mouth every morning.   Yes Historical Provider, MD  fluPHENAZine (PROLIXIN) 2.5 MG tablet  Take 2.5 mg by mouth at bedtime.    Yes Historical Provider, MD  HYDROmorphone HCl (EXALGO) 8 MG T24A SR tablet Take 8 mg by mouth daily.   Yes Historical Provider, MD  iloperidone (FANAPT) 4 MG TABS tablet Take 4 mg by mouth at bedtime.   Yes Historical Provider, MD  lidocaine (LIDODERM) 5 % Place 1 patch onto the skin as needed. Remove & Discard patch within 12 hours or as directed by MD   Yes Historical Provider, MD  lisinopril (PRINIVIL,ZESTRIL) 10 MG tablet Take 1 tablet (10 mg total) by mouth daily. 01/08/12  Yes [name], MD  Methylfol-Methylcob-Acetylcyst (CEREFOLIN NAC) 6-2-600 MG TABS Take 1 tablet by mouth 2 (two) times daily.    Yes Historical Provider, MD  OVER THE COUNTER MEDICATION Take 2 capsules by mouth daily. Cardilast - avacado soy supplement.   Yes Historical Provider, MD  oxyCODONE-acetaminophen (PERCOCET) 5-325 MG per tablet Take 1 tablet by mouth every 6 (six) hours as needed. For breakthrough pain   Yes Historical Provider, MD  polyethylene glycol (MIRALAX / GLYCOLAX) packet Take 17 g by mouth daily as needed for mild constipation.    Yes Historical Provider, MD  Rivaroxaban (XARELTO) 20 MG TABS Take 1 tablet (20 mg total) by mouth daily. 01/08/12  Yes [name], MD  senna (SENOKOT) 8.6 MG tablet Take 2 tablets by mouth at bedtime.    Yes Historical Provider, MD  tetrabenazine (XENAZINE) 12.5 MG tablet Take 12.5 mg by mouth 3 (three) times daily.   Yes Historical Provider, MD  traZODone (DESYREL) 50 MG tablet Take 200 mg by mouth at bedtime.    Yes Historical Provider, MD  venlafaxine XR (EFFEXOR-XR) 150 MG 24 hr capsule Take 150 mg by mouth daily.   Yes Historical Provider, MD  nitroGLYCERIN (NITROSTAT) 0.4 MG SL tablet Place 1 tablet (0.4 mg total) under the tongue every 5 (five) minutes as needed for chest pain. 07/05/11 12/23/12  [name], MD    Physical Exam: Filed Vitals:   12/26/12 1430 12/26/12 1500 12/26/12 1530 12/26/12 1600  BP: 126/44 112/45 125/51  116/50  Pulse: 67 65 66 67  Temp:      TempSrc:      Resp:   17 22  Height:      Weight:      SpO2: 96% 97% 96% 94%     Physical Exam: Blood pressure 116/50, pulse 67, temperature 97.7 F (36.5 C), temperature source Oral, resp. rate 22, height 5\' 4"  (1.626 m), weight 68.04 kg (150 lb), SpO2 94.00%. Gen: No acute distress. Pale Head: Normocephalic, atraumatic. Eyes: PERRL, EOMI, sclerae nonicteric. Mouth: Oropharynx clear. Dentures to the upper palate. Neck: Supple, no thyromegaly, no lymphadenopathy, no jugular venous distention. Chest: Lungs clear to auscultation bilaterally with good air movement. CV: Heart sounds are regular with a 3/6 systolic ejection murmur throughout  the precordium. Abdomen: Soft, nontender, nondistended with normal active bowel sounds. Extremities: Extremities are without clubbing, edema, or cyanosis. Skin: Warm and dry. Neuro: Alert and oriented times 3; cranial nerves II through XII grossly intact. Resting tremor consistent with known history of tardive dyskinesia. Psych: Mood and affect normal.  Labs on Admission:  Basic Metabolic Panel:  Recent Labs Lab 12/23/12 1657 12/26/12 1340  NA 132* 132*  K 4.9 4.0  CL 102 97  CO2 23 27  GLUCOSE 91 108*  BUN 18 11  CREATININE 0.7 0.65  CALCIUM 8.8 9.0   Liver Function Tests:  Recent Labs Lab 12/23/12 1657  AST 18  ALT 11  ALKPHOS 43  BILITOT 0.2*  PROT 6.6  ALBUMIN 3.7   CBC:  Recent Labs Lab 12/23/12 1657 12/26/12 1340  WBC 5.7 4.7  NEUTROABS 3.5 3.2  HGB 7.7 Repeated and verified X2.* 7.5*  HCT 24.5 Repeated and verified X2.* 24.8*  MCV 73.8* 76.5*  PLT 317.0 288    Radiological Exams on Admission: No results found.  Assessment/Plan Principal Problem:   Acute microcytic anemia Anemia panel sent. Will give one unit of packed red blood cells. Reassuring that stools were Hemoccult negative. Has outpatient GI evaluation scheduled on 01/02/2013 and prefers to leave after her  blood transfusion. Active Problems:   Essential hypertension, benign Continue lisinopril.   FIBRILLATION, ATRIAL Continue amiodarone and Xarelto.   Chronic pain Continue usual pain management regimen.   Depression, reactive, psychotic Continue Xanax, Prolixin, Fanapt , Effexor and Trazodone.   Tardive dyskinesia Continue Xenazine.   Code Status: Full. Family Communication: Joya Martyr (daughter): [phone]; Aairah Negrette (son) [phone]. Disposition Plan: Home after blood transfusion.  Time spent: 1 hour.  Rudolph Daoust [facility] Pager [phone]  If 7PM-7AM, please contact night-coverage [email or web address] Password [email or web address] 12/26/2012, 4:16 PM

## 2012-12-26 NOTE — Telephone Encounter (Signed)
New problem   Pt is sched to have colonoscopy and EGD on 01/02/13 need to know if pt can stop Xlerato 5 day before starting Saturday. Please advise

## 2012-12-26 NOTE — ED Provider Notes (Signed)
CSN: [id number]     Arrival date & time 12/26/12  1242 History   First MD Initiated Contact with Patient 12/26/12 1305     Chief Complaint  Patient presents with  . Abnormal Lab   (Consider location/radiation/quality/duration/timing/severity/associated sxs/prior Treatment) HPI Comments: Pt is a 79 y.o. female with Pmhx as above who presents with low Hb from labs drawn as outpt 3 days ago during cardiology appt. Pt states she has had several weeks of generalized weakness & fatigue, no SOB, CP, palpitations, n/v, d/a, fever, ab pain, bleeding gums, hematuria, bloody stool.  No hx of blood transfusions in the past. She is on Xarelto for persistent a fib.  Pt come w/ note from [facility] w/ GI for 2U transfusion over 3 hours.  The history is provided by the patient. No language interpreter was used.    Past Medical History  Diagnosis Date  . Persistent atrial fibrillation   . Sinus bradycardia   . Edema   . DDD (degenerative disc disease)   . HTN (hypertension)   . Depression, reactive, psychotic   . Mitral regurgitation   . Chronic pain   . Tardive dyskinesia    Past Surgical History  Procedure Laterality Date  . Laminectomy      x 2, 2nd one w/ fusion  . Cesarean section    . Cardioversion  12/02/2010    Procedure: CARDIOVERSION;  Surgeon: Marca Ancona, MD;  Location: [facility] OR;  Service: Cardiovascular;  Laterality: N/A;  OUTPATIENT CARDIOVERSION  . Cataract surgery      march and June 2014  . Hernia repair     Family History  Problem Relation Age of Onset  . Arrhythmia Neg Hx   . Cancer Sister   . Alcoholism Father    History  Substance Use Topics  . Smoking status: Former Smoker    Quit date: 12/26/2009  . Smokeless tobacco: Never Used  . Alcohol Use: No     Comment: None   OB History   Grav Para Term Preterm Abortions TAB SAB Ect Mult Living                 Review of Systems  Constitutional: Positive for fatigue. Negative for fever, chills, diaphoresis, activity  change and appetite change.  HENT: Negative for congestion, facial swelling, rhinorrhea and sore throat.   Eyes: Negative for photophobia and discharge.  Respiratory: Negative for cough, chest tightness and shortness of breath.   Cardiovascular: Negative for chest pain, palpitations and leg swelling.  Gastrointestinal: Negative for nausea, vomiting, abdominal pain and diarrhea.  Endocrine: Negative for polydipsia and polyuria.  Genitourinary: Negative for dysuria, frequency, difficulty urinating and pelvic pain.  Musculoskeletal: Negative for arthralgias, back pain, neck pain and neck stiffness.  Skin: Negative for color change and wound.  Allergic/Immunologic: Negative for immunocompromised state.  Neurological: Positive for weakness. Negative for facial asymmetry, numbness and headaches.  Hematological: Does not bruise/bleed easily.  Psychiatric/Behavioral: Negative for confusion and agitation.    Allergies  Cleocin; Demerol; Erythromycin; Meperidine hcl; and Penicillins  Home Medications   Current Outpatient Rx  Name  Route  Sig  Dispense  Refill  . ALPRAZolam (XANAX) 0.25 MG tablet   Oral   Take 0.25 mg by mouth at bedtime.          Marland Kitchen amiodarone (PACERONE) 200 MG tablet   Oral   Take 0.5 tablets (100 mg total) by mouth daily.   90 tablet   3   . docusate sodium (COLACE)  100 MG capsule   Oral   Take 100 mg by mouth every morning.         . fluPHENAZine (PROLIXIN) 2.5 MG tablet   Oral   Take 2.5 mg by mouth at bedtime.          Marland Kitchen HYDROmorphone HCl (EXALGO) 8 MG T24A SR tablet   Oral   Take 8 mg by mouth daily.         Marland Kitchen iloperidone (FANAPT) 4 MG TABS tablet   Oral   Take 4 mg by mouth at bedtime.         . lidocaine (LIDODERM) 5 %   Transdermal   Place 1 patch onto the skin as needed. Remove & Discard patch within 12 hours or as directed by MD         . lisinopril (PRINIVIL,ZESTRIL) 10 MG tablet   Oral   Take 1 tablet (10 mg total) by mouth  daily.   90 tablet   3   . Methylfol-Methylcob-Acetylcyst (CEREFOLIN NAC) 6-2-600 MG TABS   Oral   Take 1 tablet by mouth 2 (two) times daily.          Marland Kitchen OVER THE COUNTER MEDICATION   Oral   Take 2 capsules by mouth daily. Cardilast - avacado soy supplement.         Marland Kitchen oxyCODONE-acetaminophen (PERCOCET) 5-325 MG per tablet   Oral   Take 1 tablet by mouth every 6 (six) hours as needed. For breakthrough pain         . polyethylene glycol (MIRALAX / GLYCOLAX) packet   Oral   Take 17 g by mouth daily as needed for mild constipation.          . Rivaroxaban (XARELTO) 20 MG TABS   Oral   Take 1 tablet (20 mg total) by mouth daily.   90 tablet   3   . senna (SENOKOT) 8.6 MG tablet   Oral   Take 2 tablets by mouth at bedtime.          Marland Kitchen tetrabenazine (XENAZINE) 12.5 MG tablet   Oral   Take 12.5 mg by mouth 3 (three) times daily.         . traZODone (DESYREL) 50 MG tablet   Oral   Take 200 mg by mouth at bedtime.          Marland Kitchen venlafaxine XR (EFFEXOR-XR) 150 MG 24 hr capsule   Oral   Take 150 mg by mouth daily.         Marland Kitchen EXPIRED: nitroGLYCERIN (NITROSTAT) 0.4 MG SL tablet   Sublingual   Place 1 tablet (0.4 mg total) under the tongue every 5 (five) minutes as needed for chest pain.   90 tablet   3    BP 116/50  Pulse 67  Temp(Src) 97.7 F (36.5 C) (Oral)  Resp 22  Ht 5\' 4"  (1.626 m)  Wt 150 lb (68.04 kg)  BMI 25.73 kg/m2  SpO2 94% Physical Exam  Constitutional: She is oriented to person, place, and time. She appears well-developed and well-nourished. She has a sickly appearance. No distress.  HENT:  Head: Normocephalic and atraumatic.  Mouth/Throat: No oropharyngeal exudate.  Eyes: Pupils are equal, round, and reactive to light.  Neck: Normal range of motion. Neck supple.  Cardiovascular: Normal rate and regular rhythm.  Exam reveals no gallop and no friction rub.   Murmur heard.  Systolic murmur is present with a grade of 3/6  Pulmonary/Chest:  Effort  normal and breath sounds normal. No respiratory distress. She has no wheezes. She has no rales.  Abdominal: Soft. Bowel sounds are normal. She exhibits no distension and no mass. There is no tenderness. There is no rebound and no guarding.  Musculoskeletal: Normal range of motion. She exhibits no edema and no tenderness.  Neurological: She is alert and oriented to person, place, and time.  Skin: Skin is warm and dry. There is pallor.  Psychiatric: She has a normal mood and affect.    ED Course  Procedures (including critical care time) Labs Review Labs Reviewed  CBC WITH DIFFERENTIAL - Abnormal; Notable for the following:    RBC 3.24 (*)    Hemoglobin 7.5 (*)    HCT 24.8 (*)    MCV 76.5 (*)    MCH 23.1 (*)    RDW 16.1 (*)    All other components within normal limits  BASIC METABOLIC PANEL - Abnormal; Notable for the following:    Sodium 132 (*)    Glucose, Bld 108 (*)    GFR calc non Af Amer 83 (*)    All other components within normal limits  RETICULOCYTES - Abnormal; Notable for the following:    RBC. 3.23 (*)    All other components within normal limits  PROTIME-INR  VITAMIN B12  FOLATE  IRON AND TIBC  FERRITIN  OCCULT BLOOD, POC DEVICE  TYPE AND SCREEN  PREPARE RBC (CROSSMATCH)  ABO/RH   Imaging Review No results found.  EKG Interpretation   None       MDM   1. Anemia   2. Chronic pain   3. Depression, reactive, psychotic    Pt is a 79 y.o. female with Pmhx as above who presents with low Hb from labs drawn as outpt 3 days ago during cardiology appt. Pt states she has had several weeks of generalized weakness & fatigue, no SOB, CP, palpitations, n/v, d/a, fever, ab pain, bleeding gums, hematuria, bloody stool.  No hx of blood transfusions in the past. She is on Xarelto for persistent a fib.  Pt come w/ note from [facility] w/ GI for 2U transfusion over 3 hours.  Pt hypotensive in 80's, 90's systolic for several reads, though later improved.  On PE, pt  pale, chronically ill appearing.  Blowing systolic murmur, benign ab exam. Neg POC stool card. Repeat Hb here 7.5.  Given no prior hx of anemia and transient hypotension, triad consulted & will admit.  Will begin 1U PRBC to be transfused over 3 hrs in ED.  Anemia panel ordered.         Shanna Cisco, MD 12/26/12 [phone]

## 2012-12-26 NOTE — Progress Notes (Signed)
Discharged at 2340 accompanied by  Son.  Verbalized good understanding of discharge plan

## 2012-12-27 LAB — FOLATE: Folate: >20 ng/mL

## 2012-12-27 LAB — IRON AND TIBC
Iron: 13 ug/dL — ABNORMAL LOW (ref 42–135)
TIBC: 449 ug/dL (ref 250–470)

## 2012-12-27 LAB — FERRITIN: Ferritin: 4 ng/mL — ABNORMAL LOW (ref 10–291)

## 2012-12-27 NOTE — Discharge Summary (Signed)
Physician Discharge Summary  [name] ZOX:[id number] DOB: 02-13-1932 DOA: 12/26/2012  PCP: Thora Lance, MD  Admit date: 12/26/2012 Discharge date: 12/26/2012  Recommendations for Outpatient Follow-up:  1. F/U CBC in 2-3 days.  Consider iron replacement therapy.   Discharge Diagnoses:  Principal Problem:    Acute microcytic anemia Active Problems:    Essential hypertension, benign    FIBRILLATION, ATRIAL    Chronic pain    Depression, reactive, psychotic   Discharge Condition: Stable.  Diet recommendation: Low sodium, heart healthy.  History of present illness:  Kathryn Smith is an 79 y.o. female with a PMH of atrial fibrillation on chronic anticoagulation with Xarelto, recent onset of anemia under the evaluation of Dr. Bosie Clos with plans for an outpatient colonoscopy and endoscopy scheduled for 01/02/2013, who presented to the ER at Dr. Marge Duncans direction to receive a blood transfusion. The patient complains of a three-week history of progressive fatigue but no complaints of dizziness, orthostatic hypotension, chest pain, or shortness of breath. The patient's hemoglobin was noted to be 10.8 on 01/08/2012. On 12/23/2012, it had dropped to 7.7, and it was 7.5 on initial evaluation here today. The ER physician performed a fecal occult blood test which was negative, and the patient denies black or bloody stools.   Hospital Course by problem:  Principal Problem:  Acute microcytic anemia  Anemia panel sent. Iron deficient.  Given one unit of packed red blood cells. Reassuring that stools were Hemoccult negative. Has outpatient GI evaluation scheduled on 01/02/2013. Will likely need iron replacement therapy. Active Problems:  Essential hypertension, benign  Continue lisinopril.  FIBRILLATION, ATRIAL  Continue amiodarone and Xarelto.  Chronic pain  Continue usual pain management regimen.  Depression, reactive, psychotic  Continue Xanax, Prolixin, Fanapt , Effexor and  Trazodone.  Tardive dyskinesia  Continue Xenazine.   Procedures:  None.  Consultations:  None.  Discharge Exam: Filed Vitals:   12/26/12 2201  BP: 120/48  Pulse: 63  Temp: 97.9 F (36.6 C)  Resp: 18   Filed Vitals:   12/26/12 1915 12/26/12 2000 12/26/12 2114 12/26/12 2201  BP: 100/58 130/57 144/47 120/48  Pulse: 71 64 67 63  Temp: 97.4 F (36.3 C) 97.8 F (36.6 C)  97.9 F (36.6 C)  TempSrc:      Resp: 18 18  18   Height:      Weight:      SpO2:  94% 93% 94%    Gen:  NAD Cardiovascular:  RRR, No M/R/G Respiratory: Lungs CTAB Gastrointestinal: Abdomen soft, NT/ND with normal active bowel sounds. Extremities: No C/E/C   Discharge Instructions  Discharge Orders   Future Appointments Provider Department Dept Phone   06/30/2013 10:00 AM Rosalio Macadamia, NP [facility] [phone]   Future Orders Complete By Expires   Call MD for:  extreme fatigue  As directed    Call MD for:  persistant dizziness or light-headedness  As directed    Call MD for:  As directed    Scheduling Instructions:     Black or bloody stool.   Diet - low sodium heart healthy  As directed    Discharge instructions  As directed    Comments:     Follow up with Dr. Bosie Clos or your PCP regarding the results of your iron, folic acid and B12 levels.   Increase activity slowly  As directed        Medication List         ALPRAZolam 0.25 MG tablet  Commonly known as:  XANAX  Take 0.25 mg by mouth at bedtime.     amiodarone 200 MG tablet  Commonly known as:  PACERONE  Take 0.5 tablets (100 mg total) by mouth daily.     CEREFOLIN NAC 6-2-600 MG Tabs  Take 1 tablet by mouth 2 (two) times daily.     docusate sodium 100 MG capsule  Commonly known as:  COLACE  Take 100 mg by mouth every morning.     fluPHENAZine 2.5 MG tablet  Commonly known as:  PROLIXIN  Take 2.5 mg by mouth at bedtime.     HYDROmorphone HCl 8 MG T24a SR tablet  Commonly known as:  EXALGO  Take  8 mg by mouth daily.     iloperidone 4 MG Tabs tablet  Commonly known as:  FANAPT  Take 4 mg by mouth at bedtime.     LIDODERM 5 %  Generic drug:  lidocaine  Place 1 patch onto the skin as needed. Remove & Discard patch within 12 hours or as directed by MD     lisinopril 10 MG tablet  Commonly known as:  PRINIVIL,ZESTRIL  Take 1 tablet (10 mg total) by mouth daily.     nitroGLYCERIN 0.4 MG SL tablet  Commonly known as:  NITROSTAT  Place 1 tablet (0.4 mg total) under the tongue every 5 (five) minutes as needed for chest pain.     OVER THE COUNTER MEDICATION  Take 2 capsules by mouth daily. Cardilast - avacado soy supplement.     oxyCODONE-acetaminophen 5-325 MG per tablet  Commonly known as:  PERCOCET/ROXICET  Take 1 tablet by mouth every 6 (six) hours as needed. For breakthrough pain     polyethylene glycol packet  Commonly known as:  MIRALAX / GLYCOLAX  Take 17 g by mouth daily as needed for mild constipation.     Rivaroxaban 20 MG Tabs tablet  Commonly known as:  XARELTO  Take 1 tablet (20 mg total) by mouth daily.     senna 8.6 MG tablet  Commonly known as:  SENOKOT  Take 2 tablets by mouth at bedtime.     tetrabenazine 12.5 MG tablet  Commonly known as:  XENAZINE  Take 12.5 mg by mouth 3 (three) times daily.     traZODone 50 MG tablet  Commonly known as:  DESYREL  Take 200 mg by mouth at bedtime.     venlafaxine XR 150 MG 24 hr capsule  Commonly known as:  EFFEXOR-XR  Take 150 mg by mouth daily.           Follow-up Information   Follow up with Thora Lance, MD. Schedule an appointment as soon as possible for a visit in 1 week.   Specialty:  Family Medicine   Contact information:   [address], [address] [place] [address] [phone]       Follow up with Shirley Friar., MD. (At your scheduled appt time.)    Specialty:  Gastroenterology   Contact information:   [address] [place] [address] [phone]         The results of significant diagnostics from this hospitalization (including imaging, microbiology, ancillary and laboratory) are listed below for reference.    Significant Diagnostic Studies: No results found.  Labs:  Basic Metabolic Panel:  Recent Labs Lab 12/23/12 1657 12/26/12 1340  NA 132* 132*  K 4.9 4.0  CL 102 97  CO2 23 27  GLUCOSE 91 108*  BUN  18 11  CREATININE 0.7 0.65  CALCIUM 8.8 9.0   GFR Estimated Creatinine Clearance: 55.1 ml/min (by C-G formula based on Cr of 0.65). Liver Function Tests:  Recent Labs Lab 12/23/12 1657  AST 18  ALT 11  ALKPHOS 43  BILITOT 0.2*  PROT 6.6  ALBUMIN 3.7   Coagulation profile  Recent Labs Lab 12/26/12 1429  INR 0.99    CBC:  Recent Labs Lab 12/23/12 1657 12/26/12 1340 12/26/12 2248  WBC 5.7 4.7  --   NEUTROABS 3.5 3.2  --   HGB 7.7 Repeated and verified X2.* 7.5* 8.7*  HCT 24.5 Repeated and verified X2.* 24.8* 28.1*  MCV 73.8* 76.5*  --   PLT 317.0 288  --    Anemia work up  Recent Labs  12/26/12 1340  VITAMINB12 >2000*  FOLATE >20.0  FERRITIN 4*  TIBC 449  IRON 13*  RETICCTPCT 1.3    Time coordinating discharge: 20 minutes.  Signed:  Shanel Prazak  Pager [phone] [facility] 12/27/2012, 5:45 PM

## 2012-12-27 NOTE — Telephone Encounter (Signed)
Kathryn Smith needs a answer and call back today.

## 2012-12-27 NOTE — Telephone Encounter (Signed)
Left message for  Kathryn Smith and let her know Xarelto is not typically held for 5 days.  Usually hold for 24-48 hours prior and resume the day after procedure. Dr Johney Frame will be here on Monday to address and I will send over his recommendations

## 2012-12-27 NOTE — Telephone Encounter (Signed)
I agree that holding xarelto for 5 days is typically not recommended for routine procedures.  If the patient is not actively bleeding, I would recommend hold for 24 hours prior to the procedure.  If no intervention or bleeding, xarelto can be restarted the same day as the procedure.  If intervention is performed, hold for 24 hours post procedure and then resume if ok with GI.

## 2012-12-30 LAB — TYPE AND SCREEN
ABO/RH(D): O POS
Antibody Screen: NEGATIVE
Unit division: 0
Unit division: 0

## 2012-12-30 NOTE — Telephone Encounter (Signed)
Melissa aware   Faxing over recommendations

## 2012-12-31 ENCOUNTER — Encounter (HOSPITAL_COMMUNITY): Payer: Self-pay | Admitting: *Deleted

## 2013-01-01 ENCOUNTER — Other Ambulatory Visit: Payer: Self-pay | Admitting: Gastroenterology

## 2013-01-01 NOTE — Addendum Note (Signed)
Addended by: Timoth Schara on: 01/01/2013 04:06 PM   Modules accepted: Orders  

## 2013-01-02 ENCOUNTER — Encounter (HOSPITAL_COMMUNITY)
Admission: RE | Disposition: A | Discharge status: Home / Self Care | Payer: Self-pay | Source: Ambulatory Visit | Attending: Gastroenterology

## 2013-01-02 ENCOUNTER — Ambulatory Visit (HOSPITAL_COMMUNITY): Payer: Medicare HMO | Admitting: Anesthesiology

## 2013-01-02 ENCOUNTER — Encounter (HOSPITAL_COMMUNITY): Payer: Medicare HMO | Admitting: Anesthesiology

## 2013-01-02 ENCOUNTER — Ambulatory Visit (HOSPITAL_COMMUNITY)
Admission: RE | Admit: 2013-01-02 | Discharge: 2013-01-02 | Disposition: A | Discharge status: Home / Self Care | Payer: Medicare HMO | Source: Ambulatory Visit | Attending: Gastroenterology | Admitting: Gastroenterology

## 2013-01-02 DIAGNOSIS — K2970 Gastritis, unspecified, without bleeding: Secondary | ICD-10-CM | POA: Insufficient documentation

## 2013-01-02 DIAGNOSIS — G2401 Drug induced subacute dyskinesia: Secondary | ICD-10-CM | POA: Insufficient documentation

## 2013-01-02 DIAGNOSIS — I10 Essential (primary) hypertension: Secondary | ICD-10-CM | POA: Insufficient documentation

## 2013-01-02 DIAGNOSIS — D509 Iron deficiency anemia, unspecified: Secondary | ICD-10-CM

## 2013-01-02 DIAGNOSIS — K648 Other hemorrhoids: Secondary | ICD-10-CM | POA: Insufficient documentation

## 2013-01-02 DIAGNOSIS — Q438 Other specified congenital malformations of intestine: Secondary | ICD-10-CM | POA: Insufficient documentation

## 2013-01-02 HISTORY — PX: COLONOSCOPY WITH PROPOFOL: SHX5780

## 2013-01-02 HISTORY — DX: Chronic obstructive pulmonary disease, unspecified: J44.9

## 2013-01-02 HISTORY — DX: Anemia, unspecified: D64.9

## 2013-01-02 HISTORY — DX: Gastro-esophageal reflux disease without esophagitis: K21.9

## 2013-01-02 HISTORY — PX: ESOPHAGOGASTRODUODENOSCOPY (EGD) WITH PROPOFOL: SHX5813

## 2013-01-02 SURGERY — ESOPHAGOGASTRODUODENOSCOPY (EGD) WITH PROPOFOL
Anesthesia: Monitor Anesthesia Care

## 2013-01-02 MED ORDER — GLYCOPYRROLATE 0.2 MG/ML IJ SOLN
INTRAMUSCULAR | Status: AC
  Filled 2013-01-02: qty 1

## 2013-01-02 MED ORDER — SODIUM CHLORIDE 0.9 % IV SOLN: INTRAVENOUS | Status: DC

## 2013-01-02 MED ORDER — LACTATED RINGERS IV SOLN
INTRAVENOUS | Status: DC
  Administered 2013-01-02: 1000 mL via INTRAVENOUS

## 2013-01-02 MED ORDER — PROPOFOL 10 MG/ML IV BOLUS
INTRAVENOUS | Status: AC
  Filled 2013-01-02: qty 20

## 2013-01-02 MED ORDER — FENTANYL CITRATE 0.05 MG/ML IJ SOLN
INTRAMUSCULAR | Status: AC
  Filled 2013-01-02: qty 2

## 2013-01-02 MED ORDER — LACTATED RINGERS IV SOLN
INTRAVENOUS | Status: DC | PRN
  Administered 2013-01-02: 08:00:00 via INTRAVENOUS

## 2013-01-02 MED ORDER — FENTANYL CITRATE 0.05 MG/ML IJ SOLN
INTRAMUSCULAR | Status: DC | PRN
  Administered 2013-01-02: 25 ug via INTRAVENOUS

## 2013-01-02 MED ORDER — PROPOFOL 10 MG/ML IV BOLUS
INTRAVENOUS | Status: AC
  Filled 2013-01-02: qty 40

## 2013-01-02 MED ORDER — MIDAZOLAM HCL 2 MG/2ML IJ SOLN
INTRAMUSCULAR | Status: AC
  Filled 2013-01-02: qty 2

## 2013-01-02 MED ORDER — BUTAMBEN-TETRACAINE-BENZOCAINE 2-2-14 % EX AERO
INHALATION_SPRAY | CUTANEOUS | Status: DC | PRN
  Administered 2013-01-02: 2 via TOPICAL

## 2013-01-02 MED ORDER — PROPOFOL INFUSION 10 MG/ML OPTIME
INTRAVENOUS | Status: DC | PRN
  Administered 2013-01-02: 150 ug/kg/min via INTRAVENOUS
  Administered 2013-01-02: 100 ug/kg/min via INTRAVENOUS

## 2013-01-02 MED ORDER — LIDOCAINE HCL (CARDIAC) 20 MG/ML IV SOLN
INTRAVENOUS | Status: AC
  Filled 2013-01-02: qty 5

## 2013-01-02 MED ORDER — LIDOCAINE HCL (CARDIAC) 20 MG/ML IV SOLN
INTRAVENOUS | Status: DC | PRN
  Administered 2013-01-02: 40 mg via INTRAVENOUS

## 2013-01-02 SURGICAL SUPPLY — 24 items

## 2013-01-02 NOTE — Op Note (Signed)
[facility] [address] [place], [address]   COLONOSCOPY PROCEDURE REPORT  [name], Kathryn Smith  MR#: [id number] BIRTHDATE: Nov 28, 1932 , 80  yrs. old GENDER: Female ENDOSCOPIST: Charlott Rakes, MD REFERRED XB:[id number] Ehinger, M.D. PROCEDURE DATE:  01/02/2013 PROCEDURE:   Colonoscopy, diagnostic ASA CLASS:   Class III INDICATIONS:Iron Deficiency Anemia. MEDICATIONS: See Anesthesia Report.  DESCRIPTION OF PROCEDURE:   After the risks benefits and alternatives of the procedure were thoroughly explained, informed consent was obtained.  The Pentax Ped Colon [id number]  endoscope was introduced through the anus and advanced to the cecum, which was identified by both the appendix and ileocecal valve , limited by excessive looping, redundant colon, and fair prep. No adverse events experienced.   The quality of the prep was fair. .  The instrument was then slowly withdrawn as the colon was fully examined.     FINDINGS:  Rectal exam unremarkable.  Pediatric colonoscope inserted into the colon and advanced to the cecum, where the appendiceal orifice and ileocecal valve were identified.  In order to reach the cecum repeated loop reduction, abdominal pressure, and repeated lavage was necessary.  On careful withdrawal of the colonoscope no mucosal abnormalities were seen.   Retroflexion revealed small internal hemorrhoids.  COMPLICATIONS: None  IMPRESSION:     Small internal hemorrhoids; No source of anemia seen   RECOMMENDATIONS: Consider capsule endoscopy vs CT enterography. Consider hematology consult    ______________________________ eSigned:  Charlott Rakes, MD 01/02/2013 9:39 AM   GM:[id number] Manus Gunning, MD

## 2013-01-02 NOTE — Anesthesia Preprocedure Evaluation (Addendum)
Anesthesia Evaluation    Airway Mallampati: II TM Distance: >3 FB Neck ROM: Full    Dental no notable dental hx. (+) Upper Dentures, Lower Dentures, Edentulous Upper and Edentulous Lower   Pulmonary COPDformer smoker,  breath sounds clear to auscultation  Pulmonary exam normal       Cardiovascular hypertension, + dysrhythmias Atrial Fibrillation + Valvular Problems/Murmurs MR Rhythm:Regular Rate:Normal     Neuro/Psych Tardive dyskinesia    GI/Hepatic   Endo/Other    Renal/GU      Musculoskeletal   Abdominal   Peds  Hematology  (+) anemia ,   Anesthesia Other Findings   Reproductive/Obstetrics                          Anesthesia Physical Anesthesia Plan  ASA: III  Anesthesia Plan: MAC   Post-op Pain Management:    Induction:   Airway Management Planned: Simple Face Mask  Additional Equipment:   Intra-op Plan:   Post-operative Plan:   Informed Consent: I have reviewed the patients History and Physical, chart, labs and discussed the procedure including the risks, benefits and alternatives for the proposed anesthesia with the patient or authorized representative who has indicated his/her understanding and acceptance.   Dental advisory given  Plan Discussed with: CRNA  Anesthesia Plan Comments:         Anesthesia Quick Evaluation

## 2013-01-02 NOTE — H&P (Signed)
  Date of Initial H&P: 12/26/12  History reviewed, patient examined, no change in status, stable for surgery.

## 2013-01-02 NOTE — Op Note (Signed)
[facility] [address] [place], [address]   ENDOSCOPY PROCEDURE REPORT  [name], Kathryn Smith  MR#: [id number] BIRTHDATE: May 21, 1932 , 80  yrs. old GENDER: Female  ENDOSCOPIST: Charlott Rakes, MD REFERRED AO:[id number] Ehinger, M.D.  PROCEDURE DATE:  01/02/2013 PROCEDURE:   EGD, diagnostic ASA CLASS:   Class III INDICATIONS:Iron deficiency anemia. MEDICATIONS: See Anesthesia Report.  TOPICAL ANESTHETIC:  DESCRIPTION OF PROCEDURE:   After the risks benefits and alternatives of the procedure were thoroughly explained, informed consent was obtained.  The     endoscope was introduced through the mouth and advanced to the second portion of the duodenum , limited by Without limitations.   The instrument was slowly withdrawn as the mucosa was fully examined.     FINDINGS: The endoscope was inserted into the oropharynx and esophagus was intubated. The esophagus and the gastroesophageal junction were normal in appearance. Endoscope was advanced into the stomach, which revealed a focal nodule in the antrum and mild erythema consistent with mild gastritis. Stomach otherwise normal in appearance. The endoscope was advanced to the duodenal bulb and second portion of duodenum which were unremarkable.  The endoscope was withdrawn back into the stomach and retroflexion revealed a normal proximal stomach.  COMPLICATIONS: None  ENDOSCOPIC IMPRESSION:     Mild gastritis o/w normal EGD  RECOMMENDATIONS: Proceed with colonoscopy   REPEAT EXAM: N/A  _______________________________ Charlott Rakes, MD eSigned:  Charlott Rakes, MD 01/02/2013 9:35 AM    HQ:[id number] Ehinger, MD  PATIENT NAME:  [place], Schey MR#: [id number]

## 2013-01-02 NOTE — Transfer of Care (Signed)
Immediate Anesthesia Transfer of Care Note  [name]  Procedure(s) Performed: Procedure(s): ESOPHAGOGASTRODUODENOSCOPY (EGD) WITH PROPOFOL (N/A) COLONOSCOPY WITH PROPOFOL (N/A)  Patient Location: PACU  Anesthesia Type:MAC  Level of Consciousness: awake, alert , oriented and patient cooperative  Airway & Oxygen Therapy: Patient Spontanous Breathing and Patient connected to face mask oxygen  Post-op Assessment: Report given to PACU RN, Post -op Vital signs reviewed and stable and Patient moving all extremities X 4  Post vital signs: stable  Complications: No apparent anesthesia complications

## 2013-01-02 NOTE — Anesthesia Postprocedure Evaluation (Signed)
  Anesthesia Post-op Note  [name]  Procedure(s) Performed: Procedure(s) (LRB): ESOPHAGOGASTRODUODENOSCOPY (EGD) WITH PROPOFOL (N/A) COLONOSCOPY WITH PROPOFOL (N/A)  Patient Location: PACU  Anesthesia Type: MAC  Level of Consciousness: awake and alert   Airway and Oxygen Therapy: Patient Spontanous Breathing  Post-op Pain: mild  Post-op Assessment: Post-op Vital signs reviewed, Patient's Cardiovascular Status Stable, Respiratory Function Stable, Patent Airway and No signs of Nausea or vomiting  Last Vitals:  Filed Vitals:   01/02/13 0933  BP: 96/66  Pulse: 73  Temp: 36.7 C  Resp: 20    Post-op Vital Signs: stable   Complications: No apparent anesthesia complications

## 2013-01-02 NOTE — Interval H&P Note (Signed)
History and Physical Interval Note:  01/02/2013 8:21 AM  Kathryn Smith  has presented today for surgery, with the diagnosis of anemia  The various methods of treatment have been discussed with the patient and family. After consideration of risks, benefits and other options for treatment, the patient has consented to  Procedure(s): ESOPHAGOGASTRODUODENOSCOPY (EGD) WITH PROPOFOL (N/A) COLONOSCOPY WITH PROPOFOL (N/A) as a surgical intervention .  The patient's history has been reviewed, patient examined, no change in status, stable for surgery.  I have reviewed the patient's chart and labs.  Questions were answered to the patient's satisfaction.     Crystal Ellwood C.

## 2013-01-03 ENCOUNTER — Encounter (HOSPITAL_COMMUNITY): Payer: Self-pay | Admitting: Gastroenterology

## 2013-01-19 ENCOUNTER — Emergency Department (HOSPITAL_COMMUNITY): Payer: Medicare HMO

## 2013-01-19 ENCOUNTER — Encounter (HOSPITAL_COMMUNITY): Payer: Self-pay | Admitting: Emergency Medicine

## 2013-01-19 ENCOUNTER — Observation Stay (HOSPITAL_COMMUNITY)
Admission: EM | Admit: 2013-01-19 | Discharge: 2013-01-20 | Disposition: A | Discharge status: Home / Self Care | Payer: Medicare HMO | Attending: Cardiovascular Disease | Admitting: Cardiovascular Disease

## 2013-01-19 DIAGNOSIS — Z881 Allergy status to other antibiotic agents status: Secondary | ICD-10-CM | POA: Insufficient documentation

## 2013-01-19 DIAGNOSIS — I059 Rheumatic mitral valve disease, unspecified: Secondary | ICD-10-CM | POA: Insufficient documentation

## 2013-01-19 DIAGNOSIS — I359 Nonrheumatic aortic valve disorder, unspecified: Secondary | ICD-10-CM | POA: Insufficient documentation

## 2013-01-19 DIAGNOSIS — Z9889 Other specified postprocedural states: Secondary | ICD-10-CM | POA: Insufficient documentation

## 2013-01-19 DIAGNOSIS — I4891 Unspecified atrial fibrillation: Secondary | ICD-10-CM

## 2013-01-19 DIAGNOSIS — Z7901 Long term (current) use of anticoagulants: Secondary | ICD-10-CM | POA: Insufficient documentation

## 2013-01-19 DIAGNOSIS — D509 Iron deficiency anemia, unspecified: Secondary | ICD-10-CM

## 2013-01-19 DIAGNOSIS — F209 Schizophrenia, unspecified: Secondary | ICD-10-CM | POA: Insufficient documentation

## 2013-01-19 DIAGNOSIS — G8929 Other chronic pain: Secondary | ICD-10-CM | POA: Insufficient documentation

## 2013-01-19 DIAGNOSIS — IMO0002 Reserved for concepts with insufficient information to code with codable children: Secondary | ICD-10-CM | POA: Insufficient documentation

## 2013-01-19 DIAGNOSIS — G2401 Drug induced subacute dyskinesia: Secondary | ICD-10-CM | POA: Insufficient documentation

## 2013-01-19 DIAGNOSIS — J449 Chronic obstructive pulmonary disease, unspecified: Secondary | ICD-10-CM | POA: Insufficient documentation

## 2013-01-19 DIAGNOSIS — R079 Chest pain, unspecified: Secondary | ICD-10-CM | POA: Diagnosis present

## 2013-01-19 DIAGNOSIS — Z87891 Personal history of nicotine dependence: Secondary | ICD-10-CM | POA: Insufficient documentation

## 2013-01-19 DIAGNOSIS — R011 Cardiac murmur, unspecified: Secondary | ICD-10-CM | POA: Insufficient documentation

## 2013-01-19 DIAGNOSIS — K219 Gastro-esophageal reflux disease without esophagitis: Secondary | ICD-10-CM | POA: Insufficient documentation

## 2013-01-19 DIAGNOSIS — F323 Major depressive disorder, single episode, severe with psychotic features: Secondary | ICD-10-CM | POA: Insufficient documentation

## 2013-01-19 DIAGNOSIS — R0789 Other chest pain: Principal | ICD-10-CM | POA: Insufficient documentation

## 2013-01-19 DIAGNOSIS — Z880 Allergy status to penicillin: Secondary | ICD-10-CM | POA: Insufficient documentation

## 2013-01-19 DIAGNOSIS — I10 Essential (primary) hypertension: Secondary | ICD-10-CM | POA: Insufficient documentation

## 2013-01-19 DIAGNOSIS — Z79899 Other long term (current) drug therapy: Secondary | ICD-10-CM | POA: Insufficient documentation

## 2013-01-19 DIAGNOSIS — Z888 Allergy status to other drugs, medicaments and biological substances status: Secondary | ICD-10-CM | POA: Insufficient documentation

## 2013-01-19 DIAGNOSIS — F32A Depression, unspecified: Secondary | ICD-10-CM | POA: Insufficient documentation

## 2013-01-19 DIAGNOSIS — D649 Anemia, unspecified: Secondary | ICD-10-CM | POA: Insufficient documentation

## 2013-01-19 DIAGNOSIS — J4489 Other specified chronic obstructive pulmonary disease: Secondary | ICD-10-CM | POA: Insufficient documentation

## 2013-01-19 DIAGNOSIS — I5032 Chronic diastolic (congestive) heart failure: Secondary | ICD-10-CM

## 2013-01-19 HISTORY — DX: Nonrheumatic aortic (valve) stenosis: I35.0

## 2013-01-19 HISTORY — DX: Unspecified atrial fibrillation: I48.91

## 2013-01-19 HISTORY — DX: Chest pain, unspecified: R07.9

## 2013-01-19 HISTORY — DX: Schizophrenia, unspecified: F20.9

## 2013-01-19 LAB — POCT I-STAT TROPONIN I
Troponin i, poc: 0.02 ng/mL (ref 0.00–0.08)
Troponin i, poc: 0 ng/mL (ref 0.00–0.08)

## 2013-01-19 LAB — CBC WITH DIFFERENTIAL/PLATELET
Basophils Relative: 0 % (ref 0–1)
Hemoglobin: 8.2 g/dL — ABNORMAL LOW (ref 12.0–15.0)
Lymphocytes Relative: 16 % (ref 12–46)
Lymphs Abs: 1 10*3/uL (ref 0.7–4.0)
MCH: 23.2 pg — ABNORMAL LOW (ref 26.0–34.0)
MCHC: 29.6 g/dL — ABNORMAL LOW (ref 30.0–36.0)
Monocytes Relative: 12 % (ref 3–12)
Neutro Abs: 4.3 10*3/uL (ref 1.7–7.7)
Neutrophils Relative %: 70 % (ref 43–77)
RBC: 3.53 MIL/uL — ABNORMAL LOW (ref 3.87–5.11)
WBC: 6.2 10*3/uL (ref 4.0–10.5)

## 2013-01-19 LAB — BASIC METABOLIC PANEL
BUN: 17 mg/dL (ref 6–23)
CO2: 27 mEq/L (ref 19–32)
Calcium: 8.5 mg/dL (ref 8.4–10.5)
Chloride: 99 mEq/L (ref 96–112)
GFR calc Af Amer: >90 mL/min (ref >90)
Glucose, Bld: 96 mg/dL (ref 70–99)
Potassium: 4.5 mEq/L (ref 3.5–5.1)

## 2013-01-19 LAB — PROTIME-INR
INR: 1.05 (ref 0.00–1.49)
Prothrombin Time: 13.5 seconds (ref 11.6–15.2)

## 2013-01-19 LAB — APTT: aPTT: 31 seconds (ref 24–37)

## 2013-01-19 MED ORDER — ASPIRIN EC 81 MG PO TBEC
81.0000 mg | DELAYED_RELEASE_TABLET | Freq: Every day | ORAL | Status: DC
  Administered 2013-01-20: 81 mg via ORAL
  Filled 2013-01-19: qty 1

## 2013-01-19 MED ORDER — LISINOPRIL 10 MG PO TABS
10.0000 mg | ORAL_TABLET | Freq: Every day | ORAL | Status: DC
  Administered 2013-01-20: 10 mg via ORAL
  Filled 2013-01-19: qty 1

## 2013-01-19 MED ORDER — OXYCODONE-ACETAMINOPHEN 5-325 MG PO TABS
1.0000 | ORAL_TABLET | Freq: Four times a day (QID) | ORAL | Status: DC | PRN
Start: 2013-01-19 — End: 2013-01-20
  Administered 2013-01-19 – 2013-01-20 (×4): 1 via ORAL
  Filled 2013-01-19 (×3): qty 1

## 2013-01-19 MED ORDER — ALPRAZOLAM 0.25 MG PO TABS
0.2500 mg | ORAL_TABLET | Freq: Every day | ORAL | Status: DC
  Administered 2013-01-19: 0.25 mg via ORAL
  Filled 2013-01-19: qty 1

## 2013-01-19 MED ORDER — AMIODARONE HCL 100 MG PO TABS
100.0000 mg | ORAL_TABLET | Freq: Every day | ORAL | Status: DC
  Administered 2013-01-20: 100 mg via ORAL
  Filled 2013-01-19: qty 1

## 2013-01-19 MED ORDER — ILOPERIDONE 4 MG PO TABS
4.0000 mg | ORAL_TABLET | Freq: Every day | ORAL | Status: DC
  Administered 2013-01-19: 4 mg via ORAL
  Filled 2013-01-19 (×2): qty 1

## 2013-01-19 MED ORDER — SODIUM CHLORIDE 0.9 % IJ SOLN: 3.0000 mL | INTRAMUSCULAR | Status: DC | PRN

## 2013-01-19 MED ORDER — HEPARIN (PORCINE) IN NACL 100-0.45 UNIT/ML-% IJ SOLN
800.0000 [IU]/h | INTRAMUSCULAR | Status: DC
  Administered 2013-01-19: 800 [IU]/h via INTRAVENOUS
  Filled 2013-01-19: qty 250

## 2013-01-19 MED ORDER — SENNA 8.6 MG PO TABS
2.0000 | ORAL_TABLET | Freq: Every day | ORAL | Status: DC
  Administered 2013-01-19: 17.2 mg via ORAL
  Filled 2013-01-19 (×2): qty 2

## 2013-01-19 MED ORDER — TETRABENAZINE 12.5 MG PO TABS: 12.5000 mg | ORAL_TABLET | Freq: Three times a day (TID) | ORAL | Status: DC

## 2013-01-19 MED ORDER — VENLAFAXINE HCL ER 150 MG PO CP24
150.0000 mg | ORAL_CAPSULE | Freq: Every day | ORAL | Status: DC
  Administered 2013-01-20: 150 mg via ORAL
  Filled 2013-01-19: qty 1

## 2013-01-19 MED ORDER — TRAZODONE HCL 100 MG PO TABS
200.0000 mg | ORAL_TABLET | Freq: Every day | ORAL | Status: DC
  Administered 2013-01-19: 200 mg via ORAL
  Filled 2013-01-19 (×2): qty 2

## 2013-01-19 MED ORDER — CEREFOLIN NAC 6-2-600 MG PO TABS: 1.0000 | ORAL_TABLET | Freq: Two times a day (BID) | ORAL | Status: DC

## 2013-01-19 MED ORDER — SODIUM CHLORIDE 0.9 % IJ SOLN: 3.0000 mL | Freq: Two times a day (BID) | INTRAMUSCULAR | Status: DC

## 2013-01-19 MED ORDER — FLUPHENAZINE HCL 2.5 MG PO TABS
2.5000 mg | ORAL_TABLET | Freq: Every day | ORAL | Status: DC
  Filled 2013-01-19 (×2): qty 1

## 2013-01-19 MED ORDER — SENNOSIDES 8.6 MG PO TABS: 2.0000 | ORAL_TABLET | Freq: Every day | ORAL | Status: DC

## 2013-01-19 MED ORDER — ONDANSETRON HCL 4 MG/2ML IJ SOLN: 4.0000 mg | Freq: Four times a day (QID) | INTRAMUSCULAR | Status: DC | PRN

## 2013-01-19 MED ORDER — HYDROMORPHONE HCL ER 8 MG PO T24A
8.0000 mg | EXTENDED_RELEASE_TABLET | Freq: Every day | ORAL | Status: DC
  Administered 2013-01-20: 8 mg via ORAL
  Filled 2013-01-19: qty 1

## 2013-01-19 MED ORDER — SODIUM CHLORIDE 0.9 % IV SOLN
250.0000 mL | INTRAVENOUS | Status: DC | PRN
  Administered 2013-01-19: 250 mL via INTRAVENOUS

## 2013-01-19 MED ORDER — OXYCODONE-ACETAMINOPHEN 5-325 MG PO TABS
1.0000 | ORAL_TABLET | Freq: Once | ORAL | Status: AC
  Administered 2013-01-19: 1 via ORAL
  Filled 2013-01-19: qty 1

## 2013-01-19 MED ORDER — NITROGLYCERIN 0.4 MG SL SUBL: 0.4000 mg | SUBLINGUAL_TABLET | SUBLINGUAL | Status: DC | PRN

## 2013-01-19 MED ORDER — DOCUSATE SODIUM 100 MG PO CAPS
100.0000 mg | ORAL_CAPSULE | Freq: Every morning | ORAL | Status: DC
  Administered 2013-01-20: 100 mg via ORAL
  Filled 2013-01-19: qty 1

## 2013-01-19 MED ORDER — POLYETHYLENE GLYCOL 3350 17 G PO PACK
17.0000 g | PACK | Freq: Every day | ORAL | Status: DC | PRN
  Filled 2013-01-19: qty 1

## 2013-01-19 NOTE — ED Notes (Signed)
Report called to floor. NSR on monitor, HR 65. VSS.

## 2013-01-19 NOTE — H&P (Signed)
Patient ID: Kathryn Smith MRN: [id number], DOB/AGE: 1932-09-08   Admit date: 01/19/2013  Primary Physician: Thora Lance, MD Primary Cardiologist: Shela Commons. Allred, MD   Pt. Profile:  79 y/o female with h/o afib s/p dccv in 11/2010 who presented to the ED today with chest pain.  Problem List  Past Medical History  Diagnosis Date  . Atrial fibrillation     a.03/2009 DCCV;  b. 11/2010 DCCV;  c. maintained on amio 100mg  daily and xarelto.  . Sinus bradycardia   . Edema   . DDD (degenerative disc disease)   . HTN (hypertension)   . Depression, reactive, psychotic   . Mitral regurgitation     a. 03/2010 Echo: EF 60-65%, no rwma, gr 2 dd, mild MR, mild bi-atrial enlargement.  . Chronic pain   . Tardive dyskinesia   . COPD (chronic obstructive pulmonary disease)   . GERD (gastroesophageal reflux disease)   . Anemia   . Chest pain     a. 07/2011 MV: EF 65%, small, mild, fixed distal anterior defect suggestive of soft tissue attenuation, no ischemia.    Past Surgical History  Procedure Laterality Date  . Laminectomy      x 2, 2nd one w/ fusion  . Cesarean section    . Cardioversion  12/02/2010    Procedure: CARDIOVERSION;  Surgeon: Marca Ancona, MD;  Location: [facility] OR;  Service: Cardiovascular;  Laterality: N/A;  OUTPATIENT CARDIOVERSION  . Cataract surgery      march and June 2014  . Hernia repair    . Esophagogastroduodenoscopy (egd) with propofol N/A 01/02/2013    Procedure: ESOPHAGOGASTRODUODENOSCOPY (EGD) WITH PROPOFOL;  Surgeon: Shirley Friar, MD;  Location: [facility];  Service: Endoscopy;  Laterality: N/A;  . Colonoscopy with propofol N/A 01/02/2013    Procedure: COLONOSCOPY WITH PROPOFOL;  Surgeon: Shirley Friar, MD;  Location: [facility];  Service: Endoscopy;  Laterality: N/A;     Allergies  Allergies  Allergen Reactions  . Cleocin [Clindamycin Hcl]     Gi upset  . Demerol     Gi upset  . Erythromycin     Gi upset  . Meperidine Hcl     GI upset    . Penicillins Rash    HPI  79 y/o female with h/o PAF s/p most recent DCCV in 11/2010.  She has maintained sinus rhythm since and is on amio 100mg  daily along with xarelto 20mg  daily.  She also has a h/o chest pain with neg MV in 07/2011.  In November, she had another episode of chest pain that was sharp and brief in nature.  She was last seen by Dr. Johney Frame in clinic on 12/1 and he recommended a conservative approach, given neg MV 07/2011, with a plan to repeat testing if she has recurrent chest pain.  She did well following that visit but this AM, while at a mass at her retirement community, she developed fairly focal, right chest to midsternal, sharp chest pain associated with jaw pain w/o dyspnea, n, diaphoresis.  She took 2 sl ntg at mass w/o immediate relief then walked back to her room.  While walking she actually started feeling better.  When she got back to her room, she took an additional ntg and then a nurse gave her 3 baby aspirins.  Approx 10 mins later, c/p resolved completely.  Total duration of pain was about 35 mins.  EMS was called and she was taken to the [facility] ED.  Here, she has been pain  free.  Initial troponin is normal.  She is in sinus rhythm w/o acute st/t changes.  Home Medications  Prior to Admission medications   Medication Sig Start Date End Date Taking? Authorizing Provider  ALPRAZolam (XANAX) 0.25 MG tablet Take 0.25 mg by mouth at bedtime.    Yes Historical Provider, MD  amiodarone (PACERONE) 200 MG tablet Take 0.5 tablets (100 mg total) by mouth daily. 12/23/12  Yes [name], MD  docusate sodium (COLACE) 100 MG capsule Take 100 mg by mouth every morning.   Yes Historical Provider, MD  fluPHENAZine (PROLIXIN) 2.5 MG tablet Take 2.5 mg by mouth at bedtime.    Yes Historical Provider, MD  HYDROmorphone HCl (EXALGO) 8 MG T24A SR tablet Take 8 mg by mouth daily.   Yes Historical Provider, MD  iloperidone (FANAPT) 4 MG TABS tablet Take 4 mg by mouth at bedtime.   Yes  Historical Provider, MD  lidocaine (LIDODERM) 5 % Place 1 patch onto the skin as needed. Remove & Discard patch within 12 hours or as directed by MD   Yes Historical Provider, MD  lisinopril (PRINIVIL,ZESTRIL) 10 MG tablet Take 1 tablet (10 mg total) by mouth daily. 01/08/12  Yes [name], MD  Methylfol-Methylcob-Acetylcyst (CEREFOLIN NAC) 6-2-600 MG TABS Take 1 tablet by mouth 2 (two) times daily.    Yes Historical Provider, MD  nitroGLYCERIN (NITROSTAT) 0.4 MG SL tablet Place 0.4 mg under the tongue every 5 (five) minutes as needed for chest pain.   Yes Historical Provider, MD  OVER THE COUNTER MEDICATION Take 2 capsules by mouth daily. Cardilast - avacado soy supplement.   Yes Historical Provider, MD  oxyCODONE-acetaminophen (PERCOCET) 5-325 MG per tablet Take 1 tablet by mouth every 6 (six) hours as needed (for breakthrough pain).    Yes Historical Provider, MD  polyethylene glycol (MIRALAX / GLYCOLAX) packet Take 17 g by mouth daily as needed for mild constipation.    Yes Historical Provider, MD  Rivaroxaban (XARELTO) 20 MG TABS Take 1 tablet (20 mg total) by mouth daily. 01/08/12  Yes [name], MD  senna (SENOKOT) 8.6 MG tablet Take 2 tablets by mouth at bedtime.    Yes Historical Provider, MD  tetrabenazine (XENAZINE) 12.5 MG tablet Take 12.5 mg by mouth 3 (three) times daily.   Yes Historical Provider, MD  traZODone (DESYREL) 50 MG tablet Take 200 mg by mouth at bedtime.    Yes Historical Provider, MD  venlafaxine XR (EFFEXOR-XR) 150 MG 24 hr capsule Take 150 mg by mouth daily.   Yes Historical Provider, MD    Family History  Family History  Problem Relation Age of Onset  . Arrhythmia Neg Hx   . Cancer Sister   . Alcoholism Father     Social History  History   Social History  . Marital Status: Married    Spouse Name: [name]    Number of Children: 2  . Years of Education: 16   Occupational History  . Retired Engineer, civil (consulting)    Social History Main Topics  . Smoking status:  Former Smoker    Quit date: 12/26/2009  . Smokeless tobacco: Never Used  . Alcohol Use: No     Comment: None  . Drug Use: No  . Sexual Activity: Not on file   Other Topics Concern  . Not on file   Social History Narrative   Married.  Resident of [employer], Independent side.  Nurses do administer her medication.  Ambulates with a walker.  Meals  are provided.     Review of Systems General:  No chills, fever, night sweats or weight changes.  Cardiovascular:  +++ chest pain, no dyspnea on exertion, edema, orthopnea, palpitations, paroxysmal nocturnal dyspnea. Dermatological: No rash, lesions/masses Respiratory: No cough, dyspnea Urologic: No hematuria, dysuria Abdominal:   No nausea, vomiting, diarrhea, bright red blood per rectum, melena, or hematemesis Neurologic:  No visual changes, wkns, changes in mental status. All other systems reviewed and are otherwise negative except as noted above.  Physical Exam  Blood pressure 161/93, pulse 70, temperature 98 F (36.7 C), resp. rate 16, SpO2 95.00%.  General: Pleasant, NAD Psych: Normal affect. Neuro: Alert and oriented X 3. Moves all extremities spontaneously. HEENT: Normal  Neck: Supple without jvd.  L>R radiated murmur vs bruit. Lungs:  Resp regular and unlabored, CTA. Heart: RRR no s3, s4.  3/6 syst murmur noted throughout and radiating to L>R carotids. Abdomen: Soft, non-tender, non-distended, BS + x 4.  Extremities: No clubbing, cyanosis or edema. DP/PT/Radials 2+ and equal bilaterally.  Labs  Troponin [facility] Test)  Recent Labs  01/19/13 1639  TROPIPOC 0.00   No results found for this basename: CKTOTAL, CKMB, TROPONINI,  in the last 72 hours Lab Results  Component Value Date   WBC 6.2 01/19/2013   HGB 8.2* 01/19/2013   HCT 27.7* 01/19/2013   MCV 78.5 01/19/2013   PLT 285 01/19/2013     Recent Labs Lab 01/19/13 1344  NA 133*  K 4.5  CL 99  CO2 27  BUN 17  CREATININE 0.78    CALCIUM 8.5  GLUCOSE 96   Radiology/Studies  Dg Chest 2 View  01/19/2013   CLINICAL DATA:  Chest pain, shortness of breath, former smoker, history hypertension, COPD  EXAM: CHEST  2 VIEW  COMPARISON:  06/26/2011  FINDINGS: Borderline enlargement of cardiac silhouette.  Atherosclerotic calcification aorta.  Mediastinal contours and pulmonary vascularity normal.  Lungs appear hyperaerated with peribronchial thickening and slight flattening of diaphragms question COPD.  No definite acute infiltrate, pleural effusion or pneumothorax.  No acute osseous findings.  IMPRESSION: Probable COPD changes.  No acute abnormalities.   Electronically Signed   By: Ulyses Southward M.D.   On: 01/19/2013 14:32    ECG  Rsr, 62, no acute st/t changes (baseline difficult to interpret.  Tele in ED reviewed and pt is in sinus w/ 1st deg avb).  ASSESSMENT AND PLAN  1.  Chest pain:  Pt presented following a 35 min episode of sharp right sided chest pain and jaw pain w/o associated Ss.  Ss resolved after a total of 3 sl ntg and 3 baby asa.  She is currently pain free.  Initial troponin is nl and ECG is w/o acute changes.  Observe, cycle ce, hold xarelto and add heparin in case she rules in and requires cath, but would only pursue cath is she rules in.  She had a neg MV in 07/2011, would plan repeat if CE remain negative.  2.  PAF:  In sinus.  Cont amio 100mg  daily.  Hold xarelto and add heparin as above.  3.  HTN:  BP is elevated in ED.  Resume home meds and follow.  4.  Mild MR:  She has an impressive murmur.  It's been almost 3 yrs since last echo.  Will repeat.  5.  Schizophrenia:  Cont home psych meds.  6.  Tremor:  Cont home med.  7.  Microcytic/hypochromic anemia:  S/p egd/colon 12/11 ->no source for  anemia found. Capsule enteroscopy vs heme consult being considered.  H/H actually better than earlier this month.  Signed, Nicolasa Ducking, NP 01/19/2013, 5:30 PM

## 2013-01-19 NOTE — ED Notes (Signed)
Alert, NAD, calm, interactive, denies CP, mentions chronic back pain, denies other sx, preparing to transport, family at [facility] x2.

## 2013-01-19 NOTE — ED Notes (Signed)
attempting report, unable.

## 2013-01-19 NOTE — ED Notes (Addendum)
Report attempted 

## 2013-01-19 NOTE — ED Provider Notes (Signed)
CSN: [id number]     Arrival date & time 01/19/13  1237 History   First MD Initiated Contact with Patient 01/19/13 1248     Chief Complaint  Patient presents with  . Chest Pain   (Consider location/radiation/quality/duration/timing/severity/associated sxs/prior Treatment) HPI  This is a 79 year old female with a history of atrial fibrillation status post ablation, hypertension, mitral regurg, and COPD who presents with chest pain. Patient reports onset of midsternal chest pain at 11 AM this morning while at church. She denies any radiation of the pain. She took an aspirin and nitroglycerin with improvement of pain. She is currently pain-free. She reports feeling sweaty during chest pain. She denies any shortness of breath. Chest pain lasted minutes. She denies any recent fevers or coughs.  Past Medical History  Diagnosis Date  . Atrial fibrillation     a.03/2009 DCCV;  b. 11/2010 DCCV;  c. maintained on amio 100mg  daily and xarelto.  . Sinus bradycardia   . Edema   . DDD (degenerative disc disease)   . HTN (hypertension)   . Depression, reactive, psychotic   . Mitral regurgitation     a. 03/2010 Echo: EF 60-65%, no rwma, gr 2 dd, mild MR, mild bi-atrial enlargement.  . Chronic pain   . Tardive dyskinesia   . COPD (chronic obstructive pulmonary disease)   . GERD (gastroesophageal reflux disease)   . Anemia   . Chest pain     a. 07/2011 MV: EF 65%, small, mild, fixed distal anterior defect suggestive of soft tissue attenuation, no ischemia.   Past Surgical History  Procedure Laterality Date  . Laminectomy      x 2, 2nd one w/ fusion  . Cesarean section    . Cardioversion  12/02/2010    Procedure: CARDIOVERSION;  Surgeon: Marca Ancona, MD;  Location: [facility] OR;  Service: Cardiovascular;  Laterality: N/A;  OUTPATIENT CARDIOVERSION  . Cataract surgery      march and June 2014  . Hernia repair    . Esophagogastroduodenoscopy (egd) with propofol N/A 01/02/2013    Procedure:  ESOPHAGOGASTRODUODENOSCOPY (EGD) WITH PROPOFOL;  Surgeon: Shirley Friar, MD;  Location: [facility];  Service: Endoscopy;  Laterality: N/A;  . Colonoscopy with propofol N/A 01/02/2013    Procedure: COLONOSCOPY WITH PROPOFOL;  Surgeon: Shirley Friar, MD;  Location: [facility];  Service: Endoscopy;  Laterality: N/A;   Family History  Problem Relation Age of Onset  . Arrhythmia Neg Hx   . Cancer Sister   . Alcoholism Father    History  Substance Use Topics  . Smoking status: Former Smoker    Quit date: 12/26/2009  . Smokeless tobacco: Never Used  . Alcohol Use: No     Comment: None   OB History   Grav Para Term Preterm Abortions TAB SAB Ect Mult Living                 Review of Systems  Constitutional: Negative for fever.  Respiratory: Positive for chest tightness. Negative for cough and shortness of breath.   Cardiovascular: Positive for chest pain. Negative for leg swelling.  Gastrointestinal: Negative for nausea, vomiting and abdominal pain.  Genitourinary: Negative for dysuria.  Musculoskeletal: Negative for back pain.  Skin: Negative for wound.  Neurological: Negative for headaches.  Psychiatric/Behavioral: Negative for confusion.  All other systems reviewed and are negative.    Allergies  Cleocin; Demerol; Erythromycin; Meperidine hcl; and Penicillins  Home Medications   Current Outpatient Rx  Name  Route  Sig  Dispense  Refill  . ALPRAZolam (XANAX) 0.25 MG tablet   Oral   Take 0.25 mg by mouth at bedtime.          Marland Kitchen amiodarone (PACERONE) 200 MG tablet   Oral   Take 0.5 tablets (100 mg total) by mouth daily.   90 tablet   3   . docusate sodium (COLACE) 100 MG capsule   Oral   Take 100 mg by mouth every morning.         . fluPHENAZine (PROLIXIN) 2.5 MG tablet   Oral   Take 2.5 mg by mouth at bedtime.          Marland Kitchen HYDROmorphone HCl (EXALGO) 8 MG T24A SR tablet   Oral   Take 8 mg by mouth daily.         Marland Kitchen iloperidone (FANAPT) 4 MG  TABS tablet   Oral   Take 4 mg by mouth at bedtime.         . lidocaine (LIDODERM) 5 %   Transdermal   Place 1 patch onto the skin as needed. Remove & Discard patch within 12 hours or as directed by MD         . lisinopril (PRINIVIL,ZESTRIL) 10 MG tablet   Oral   Take 1 tablet (10 mg total) by mouth daily.   90 tablet   3   . Methylfol-Methylcob-Acetylcyst (CEREFOLIN NAC) 6-2-600 MG TABS   Oral   Take 1 tablet by mouth 2 (two) times daily.          . nitroGLYCERIN (NITROSTAT) 0.4 MG SL tablet   Sublingual   Place 0.4 mg under the tongue every 5 (five) minutes as needed for chest pain.         Marland Kitchen OVER THE COUNTER MEDICATION   Oral   Take 2 capsules by mouth daily. Cardilast - avacado soy supplement.         Marland Kitchen oxyCODONE-acetaminophen (PERCOCET) 5-325 MG per tablet   Oral   Take 1 tablet by mouth every 6 (six) hours as needed (for breakthrough pain).          . polyethylene glycol (MIRALAX / GLYCOLAX) packet   Oral   Take 17 g by mouth daily as needed for mild constipation.          . Rivaroxaban (XARELTO) 20 MG TABS   Oral   Take 1 tablet (20 mg total) by mouth daily.   90 tablet   3   . senna (SENOKOT) 8.6 MG tablet   Oral   Take 2 tablets by mouth at bedtime.          Marland Kitchen tetrabenazine (XENAZINE) 12.5 MG tablet   Oral   Take 12.5 mg by mouth 3 (three) times daily.         . traZODone (DESYREL) 50 MG tablet   Oral   Take 200 mg by mouth at bedtime.          Marland Kitchen venlafaxine XR (EFFEXOR-XR) 150 MG 24 hr capsule   Oral   Take 150 mg by mouth daily.          BP 161/93  Pulse 70  Temp(Src) 98 F (36.7 C)  Resp 16  SpO2 95% Physical Exam  Nursing note and vitals reviewed. Constitutional: She is oriented to person, place, and time. No distress.  Elderly  HENT:  Head: Normocephalic and atraumatic.  Mouth/Throat: Oropharynx is clear and moist.  Eyes: Pupils are equal, round, and reactive to light.  Neck: Neck supple. No JVD present.   Cardiovascular: Normal rate and regular rhythm.   Murmur heard. Pulmonary/Chest: Effort normal and breath sounds normal. No respiratory distress. She has no wheezes. She exhibits no tenderness.  Abdominal: Soft. Bowel sounds are normal. There is no tenderness.  Musculoskeletal: She exhibits no edema.  Neurological: She is alert and oriented to person, place, and time.  Skin: Skin is warm and dry.  Psychiatric: She has a normal mood and affect.    ED Course  Procedures (including critical care time) Labs Review Labs Reviewed  CBC WITH DIFFERENTIAL - Abnormal; Notable for the following:    RBC 3.53 (*)    Hemoglobin 8.2 (*)    HCT 27.7 (*)    MCH 23.2 (*)    MCHC 29.6 (*)    RDW 17.2 (*)    All other components within normal limits  BASIC METABOLIC PANEL - Abnormal; Notable for the following:    Sodium 133 (*)    GFR calc non Af Amer 78 (*)    All other components within normal limits  POCT I-STAT TROPONIN I  POCT I-STAT TROPONIN I   Imaging Review Dg Chest 2 View  01/19/2013   CLINICAL DATA:  Chest pain, shortness of breath, former smoker, history hypertension, COPD  EXAM: CHEST  2 VIEW  COMPARISON:  06/26/2011  FINDINGS: Borderline enlargement of cardiac silhouette.  Atherosclerotic calcification aorta.  Mediastinal contours and pulmonary vascularity normal.  Lungs appear hyperaerated with peribronchial thickening and slight flattening of diaphragms question COPD.  No definite acute infiltrate, pleural effusion or pneumothorax.  No acute osseous findings.  IMPRESSION: Probable COPD changes.  No acute abnormalities.   Electronically Signed   By: Ulyses Southward M.D.   On: 01/19/2013 14:32    EKG Interpretation    Date/Time:  Sunday January 19 2013 12:48:42 EST Ventricular Rate:  62 PR Interval:    QRS Duration: 102 QT Interval:  441 QTC Calculation: 448 R Axis:   89 Text Interpretation:  sinus  rhythm Borderline right axis deviation Nonspecific T abnrm, anterolateral leads  Confirmed by HORTON  MD, COURTNEY ([id number]) on 01/19/2013 2:37:05 PM            MDM   1. Chest pain   Patient presents with CP that improved with nitro and ASA.  Now pain free.  APpears to be in sinus rhythm.  EKG reassuring and initial troponin neg.  Spoke with Purvis Sheffield who will evaluate to admit for CP r/o.  Shon Baton, MD 01/19/13 [phone]

## 2013-01-19 NOTE — ED Notes (Signed)
Attempting report, unable.

## 2013-01-19 NOTE — Progress Notes (Signed)
ANTICOAGULATION CONSULT NOTE - Initial Consult  Pharmacy Consult for heparin Indication: chest pain/ACS  Allergies  Allergen Reactions  . Cleocin [Clindamycin Hcl]     Gi upset  . Demerol     Gi upset  . Erythromycin     Gi upset  . Meperidine Hcl     GI upset  . Penicillins Rash    Patient Measurements: weight 68 kg, height 64 inches   Heparin Dosing Weight: 68 kg  Vital Signs: Temp: 98 F (36.7 C) (12/28 1247) BP: 116/45 mmHg (12/28 1900) Pulse Rate: 65 (12/28 1947)  Labs:  Recent Labs  01/19/13 1344  HGB 8.2*  HCT 27.7*  PLT 285  CREATININE 0.78    The CrCl is unknown because both a height and weight (above a minimum accepted value) are required for this calculation.   Medical History: Past Medical History  Diagnosis Date  . Atrial fibrillation     a.03/2009 DCCV;  b. 11/2010 DCCV;  c. maintained on amio 100mg  daily and xarelto.  . Sinus bradycardia   . Edema   . DDD (degenerative disc disease)   . HTN (hypertension)   . Depression, reactive, psychotic   . Mitral regurgitation     a. 03/2010 Echo: EF 60-65%, no rwma, gr 2 dd, mild MR, mild bi-atrial enlargement.  . Chronic pain   . Tardive dyskinesia   . COPD (chronic obstructive pulmonary disease)   . GERD (gastroesophageal reflux disease)   . Anemia   . Chest pain     a. 07/2011 MV: EF 65%, small, mild, fixed distal anterior defect suggestive of soft tissue attenuation, no ischemia.    Medications:  See med rec  Assessment: Patient is a 79 y.o F an xarelto PTA for  Afib.  She presented to [facility] today with c/o of CP.  To start heparin for r/o ACS.  Her daughter reported that the last xarelto dose her mom took PTA was on 12/27 at 5PM.  Goal of Therapy:  Heparin level 0.3-0.7 units/ml Monitor platelets by anticoagulation protocol: Yes   Plan:  1) heparin drip at 800 units/hr 2) check baseline heparin level, INR/PT, and aPTT now since xarelto can effect these levels and then 8 hr after start of  heparin drip  Kathryn Smith P 01/19/2013,8:23 PM

## 2013-01-19 NOTE — ED Notes (Signed)
Pt presents to department via [facility] for evaluation of midsternal non radiating chest pain. From [place] at [place]. Onset this morning. States she was diaphoretic and nauseous when pain began. Received (3) sublingual nitroglycerin and 325mg  ASA, denies chest pain upon arrival. Pt is alert and oriented x4. 16g LAC.

## 2013-01-19 NOTE — H&P (Signed)
The patient was seen and examined, and I agree with the assessment and plan as documented above, with modifications as noted below. Pt had one episode of chest pain relieved with one [facility] 325 mg ASA and 3 SL nitro tablets. No further chest pain. 12-lead ECG with some degree of artifact, but telemetry strips have been reviewed and show normal sinus rhythm with a 1st degree AV block. Normal nuclear myocardial perfusion study in 07/2011. Plan to cycle troponins and if normal, would repeat nuclear stress test. Will repeat echocardiogram to assess for aortic stenosis (not seen on echo in 2012) and progression of mitral regurgitation. Had mild LVH at that time. Has h/o paroxysmal atrial fibrillation. Continue amiodarone and hold Xarelto. Will administer heparin in the event patient requires coronary angiography. Monitor Hgb (currently 8.2). Recent GI workup performed.

## 2013-01-20 ENCOUNTER — Inpatient Hospital Stay (HOSPITAL_COMMUNITY): Payer: Medicare HMO

## 2013-01-20 ENCOUNTER — Encounter (HOSPITAL_COMMUNITY): Payer: Self-pay | Admitting: General Practice

## 2013-01-20 DIAGNOSIS — R079 Chest pain, unspecified: Secondary | ICD-10-CM

## 2013-01-20 DIAGNOSIS — I369 Nonrheumatic tricuspid valve disorder, unspecified: Secondary | ICD-10-CM

## 2013-01-20 LAB — BASIC METABOLIC PANEL
BUN: 14 mg/dL (ref 6–23)
CO2: 27 mEq/L (ref 19–32)
Calcium: 8.5 mg/dL (ref 8.4–10.5)
Chloride: 104 mEq/L (ref 96–112)
GFR calc non Af Amer: 83 mL/min — ABNORMAL LOW (ref >90)
Glucose, Bld: 93 mg/dL (ref 70–99)
Potassium: 4 mEq/L (ref 3.5–5.1)
Sodium: 139 mEq/L (ref 135–145)

## 2013-01-20 LAB — HEPARIN LEVEL (UNFRACTIONATED)
Heparin Unfractionated: 0.22 IU/mL — ABNORMAL LOW (ref 0.30–0.70)
Heparin Unfractionated: 0.23 IU/mL — ABNORMAL LOW (ref 0.30–0.70)

## 2013-01-20 LAB — CBC
HCT: 27.9 % — ABNORMAL LOW (ref 36.0–46.0)
Hemoglobin: 8.3 g/dL — ABNORMAL LOW (ref 12.0–15.0)
MCHC: 29.7 g/dL — ABNORMAL LOW (ref 30.0–36.0)
RBC: 3.56 MIL/uL — ABNORMAL LOW (ref 3.87–5.11)
WBC: 4.1 10*3/uL (ref 4.0–10.5)

## 2013-01-20 LAB — TSH: TSH: 0.669 u[IU]/mL (ref 0.350–4.500)

## 2013-01-20 LAB — LIPID PANEL
Total CHOL/HDL Ratio: 1.9 RATIO
Triglycerides: 34 mg/dL (ref <150)
VLDL: 7 mg/dL (ref 0–40)

## 2013-01-20 LAB — APTT: aPTT: 48 seconds — ABNORMAL HIGH (ref 24–37)

## 2013-01-20 LAB — TROPONIN I: Troponin I: <0.3 ng/mL (ref <0.30)

## 2013-01-20 MED ORDER — TECHNETIUM TC 99M SESTAMIBI GENERIC - CARDIOLITE
30.0000 | Freq: Once | INTRAVENOUS | Status: AC | PRN
  Administered 2013-01-20: 30 via INTRAVENOUS

## 2013-01-20 MED ORDER — REGADENOSON 0.4 MG/5ML IV SOLN
INTRAVENOUS | Status: AC
  Administered 2013-01-20: 0.4 mg via INTRAVENOUS
  Filled 2013-01-20: qty 5

## 2013-01-20 MED ORDER — TECHNETIUM TC 99M SESTAMIBI GENERIC - CARDIOLITE
10.0000 | Freq: Once | INTRAVENOUS | Status: AC | PRN
Start: 2013-01-20 — End: 2013-01-20
  Administered 2013-01-20: 10 via INTRAVENOUS

## 2013-01-20 MED ORDER — REGADENOSON 0.4 MG/5ML IV SOLN
0.4000 mg | Freq: Once | INTRAVENOUS | Status: AC
  Administered 2013-01-20: 0.4 mg via INTRAVENOUS

## 2013-01-20 MED ORDER — OXYCODONE-ACETAMINOPHEN 5-325 MG PO TABS
ORAL_TABLET | ORAL | Status: AC
  Administered 2013-01-20: 1 via ORAL
  Filled 2013-01-20: qty 1

## 2013-01-20 MED ORDER — HEPARIN (PORCINE) IN NACL 100-0.45 UNIT/ML-% IJ SOLN
950.0000 [IU]/h | INTRAMUSCULAR | Status: DC
  Filled 2013-01-20: qty 250

## 2013-01-20 MED ORDER — FERROUS SULFATE 325 (65 FE) MG PO TABS: 325.0000 mg | ORAL_TABLET | Freq: Two times a day (BID) | ORAL | Status: DC

## 2013-01-20 NOTE — Progress Notes (Signed)
Nutrition Brief Note  Patient identified on the Malnutrition Screening Tool (MST) Report for unsure of any weight loss. Per review of usual weights below, patient has not lost any weight over the past year. PO intake PTA was good.  Wt Readings from Last 15 Encounters:  01/20/13 150 lb (68.04 kg)  01/02/13 150 lb (68.04 kg)  01/02/13 150 lb (68.04 kg)  12/26/12 150 lb 12.7 oz (68.4 kg)  12/23/12 150 lb 6.4 oz (68.221 kg)  01/08/12 144 lb (65.318 kg)  07/24/11 135 lb (61.236 kg)  07/05/11 141 lb 1.9 oz (64.012 kg)  01/23/11 150 lb 6.4 oz (68.221 kg)  12/02/10 161 lb (73.029 kg)  12/02/10 161 lb (73.029 kg)  11/21/10 161 lb (73.029 kg)  10/24/10 166 lb (75.297 kg)  04/22/10 158 lb (71.668 kg)  02/09/10 144 lb (65.318 kg)    Body mass index is 25.73 kg/(m^2). Patient meets criteria for overweight based on current BMI.   Current diet order is NPO for procedure today. Labs and medications reviewed.   No nutrition interventions warranted at this time. If nutrition issues arise, please consult RD.    Joaquin Courts, RD, LDN, CNSC Pager [phone] After Hours Pager [phone]

## 2013-01-20 NOTE — Progress Notes (Signed)
Echo Lab  2D Echocardiogram completed.  Kathryn Smith, RDCS 01/20/2013 2:18 PM

## 2013-01-20 NOTE — Progress Notes (Signed)
ANTICOAGULATION CONSULT NOTE -follow up Pharmacy Consult for heparin Indication: chest pain/ACS  Allergies  Allergen Reactions  . Cleocin [Clindamycin Hcl]     Gi upset  . Demerol     Gi upset  . Erythromycin     Gi upset  . Meperidine Hcl     GI upset  . Penicillins Rash    Patient Measurements: weight 68 kg, height 64 inches Height: 5\' 4"  (162.6 cm) Weight: 150 lb (68.04 kg) IBW/kg (Calculated) : 54.7 Heparin Dosing Weight: 68 kg  Vital Signs: Temp: 97.8 F (36.6 C) (12/29 0550) BP: 140/45 mmHg (12/29 1100) Pulse Rate: 75 (12/29 1100)  Labs:  Recent Labs  01/19/13 1344 01/19/13 2120 01/20/13 0147 01/20/13 0510 01/20/13 0900 01/20/13 1345  HGB 8.2*  --   --  8.3*  --   --   HCT 27.7*  --   --  27.9*  --   --   PLT 285  --   --  274  --   --   APTT  --  31  --  48*  --   --   LABPROT  --  13.5  --   --   --   --   INR  --  1.05  --   --   --   --   HEPARINUNFRC  --  <0.10*  --  0.22*  --  0.23*  CREATININE 0.78  --   --  0.64  --   --   TROPONINI  --  <0.30 <0.30  --  <0.30  --     Estimated Creatinine Clearance: 54.9 ml/min (by C-G formula based on Cr of 0.64).  Assessment: Patient is a 79 y.o F an xarelto PTA for  Afib.  She presented to [facility] with c/o of CP.  Heparin started for r/o ACS.  Her daughter reported that the last xarelto dose her mom took PTA was on 12/27 at 5PM.  Her AM HL was a little low at 0.22 and the heparin rate was increased to 950 units/hr.  A repeat HL at 1345 was low at 0.23.  However this was when the RN noted that the IV was leaking and the heparin IV site came out.  Pt is refusing to have IV site restarted.  She is s/p lexiscan myoview and echo today and plan is to discharge home if myoview is negative.   Goal of Therapy:  Heparin level 0.3-0.7 units/ml Monitor platelets by anticoagulation protocol: Yes   Plan:  1. Heparin drip is currently off 2nd IV site came out 2. F/u discharge plans  Herby Abraham,  Pharm.D. [phone] 01/20/2013 3:35 PM

## 2013-01-20 NOTE — Discharge Summary (Signed)
Discharge Summary   Patient ID: Kathryn Smith MRN: [id number], DOB/AGE: 77-06-1932 77 y.o. Admit date: 01/19/2013 D/C date:     01/20/2013  Primary Care Provider: Thora Lance, MD Primary Cardiologist: Elayna Tobler  Primary Discharge Diagnoses:  1. Chest pain, atypical - negative nuclear stress test 2. Moderate AS by echo this admission 3. Mod TR by echo this admission 4. Anemia - S/p egd/colon 12/11 ->no source for anemia found. Capsule enteroscopy vs heme consult being considered as outpatient 5. Hypertension  Secondary Discharge Diagnoses:  Past Medical History  Diagnosis Date  . Atrial fibrillation     a.03/2009 DCCV;  b. 11/2010 DCCV;  c. maintained on amio 100mg  daily and xarelto.  . Sinus bradycardia   . Edema   . DDD (degenerative disc disease)   . HTN (hypertension)   . Depression, reactive, psychotic   . Mitral regurgitation     a. 03/2010 Echo: EF 60-65%, no rwma, gr 2 dd, mild MR, mild bi-atrial enlargement.  . Chronic pain   . Tardive dyskinesia   . COPD (chronic obstructive pulmonary disease)   . GERD (gastroesophageal reflux disease)   . Schizophrenia    Hospital Course: Ms. [name] is a 78 y/o F with history of afib s/p DCCV 2012 on chronic amiodarone and Xarelto, sinus bradycardia, HTN, tardive dyskinesia, depression who presented to [facility] 01/19/2013 with CP. She also has a h/o chest pain with neg MV in 07/2011. In November, she had another episode of chest pain that was sharp and brief in nature. She was last seen by Dr. Johney Frame in clinic on 12/1 and he recommended a conservative approach, given neg MV 07/2011, with a plan to repeat testing if she had recurrent chest pain. The morning of admission while [facility], she felt a fairly focal, right chest to midsternal, sharp chest pain associated with jaw pain w/o dyspnea, nausea, or diaphoresis. She took 2 SL NTG at mass w/o immediate relief then walked back to her room. While  walking she actually started feeling better. When she got back to her room, she took an additional ntg and then a nurse gave her 3 baby aspirins. Approx 10 mins later, c/p resolved completely. Total duration of pain was about 35 mins. EMS was called and she was taken to [facility] ED. Initial troponin was negative. She was in NSR without acute ST-T changes. Her pain was felt to have typical and atypical features. She was admitted for further workup and placed on heparin. Troponins remained negative. Nuclear stress test was normal with EF 71%. Echo was performed with EF 65-70%, grade 2 d/d, moderate AS with AVA 1.48cm, mod TR, PA pressure . Today she is feeling better. Dr. Johney Frame has seen and examined the patient today and feels she is stable for discharge. He considered adding beta blocker this admission to see if this helps with her symptoms, but BP was variable (98 systolic to 160s systolic) and resting HR was in upper 50s and lower 60s. We held off at discharge and can reconsider as an outpatient. She was instructed to monitor her BP as an outpatient and bring in log to her followup visit.  This admission, labwork was notable for a Hgb of 8.3 which was slightly improved from previous value - she is s/p egd/colon 12/11 with no source for anemia found. Per notes, capsule enteroscopy vs heme consult was being considered as an outpatient. Oral iron was added at discharge. There was no evidence of  active bleeding this admission. She was encouraged to keep followup with PCP/GI for further evaluation of this.  Discharge Vitals: Blood pressure 140/45, pulse 75, temperature 97.8 F (36.6 C), resp. rate 18, height 5\' 4"  (1.626 m), weight 150 lb (68.04 kg), SpO2 95.00%.  Labs: Lab Results  Component Value Date   WBC 4.1 01/20/2013   HGB 8.3* 01/20/2013   HCT 27.9* 01/20/2013   MCV 78.4 01/20/2013   PLT 274 01/20/2013     Recent Labs Lab 01/20/13 0510  NA 139  K 4.0  CL 104  CO2 27  BUN 14    CREATININE 0.64  CALCIUM 8.5  GLUCOSE 93    Recent Labs  01/19/13 2120 01/20/13 0147 01/20/13 0900  TROPONINI <0.30 <0.30 <0.30   Lab Results  Component Value Date   CHOL 172 01/20/2013   HDL 89 01/20/2013   LDLCALC 76 01/20/2013   TRIG 34 01/20/2013   Diagnostic Studies/Procedures   Dg Chest 2 View 01/19/2013   CLINICAL DATA:  Chest pain, shortness of breath, former smoker, history hypertension, COPD  EXAM: CHEST  2 VIEW  COMPARISON:  06/26/2011  FINDINGS: Borderline enlargement of cardiac silhouette.  Atherosclerotic calcification aorta.  Mediastinal contours and pulmonary vascularity normal.  Lungs appear hyperaerated with peribronchial thickening and slight flattening of diaphragms question COPD.  No definite acute infiltrate, pleural effusion or pneumothorax.  No acute osseous findings.  IMPRESSION: Probable COPD changes.  No acute abnormalities.   Electronically Signed   By: Ulyses Southward M.D.   On: 01/19/2013 14:32   Nm Myocar Multi W/spect W/wall Motion / Ef 01/20/2013   CLINICAL DATA:  Chest pain  EXAM: Lexiscan Myovue  TECHNIQUE: The patient received IV Lexiscan .4mg  over 15 seconds. 33.0 mCi of Technetium 11m Sestamibi injected at 30 seconds. Quantitative SPECT images were obtained in the vertical, horizontal and short axis planes after a 45 minute delay. Rest images were obtained with similar planes and delay using 10.2 mCi of Technetium 87m Sestamibi.  FINDINGS: ECG:  SR, normal ECG, unchanged with Lexiscan infusion  Symptoms:  Chest discomfort  RAW Data:  Minimal motion  Quantitative Gated SPECT EF:  Normal wall motion.  LV EF 74%.  Perfusion Images:  No perfusion defect.  IMPRESSION: 1.  Normal LVEF 74%, normal wall motion.  2.  Normal ECG not suggestive of ischemia.  3.  Normal scan, no perfusion defect.  Tobias Alexander   Electronically Signed   By: Tobias Alexander   On: 01/20/2013 15:40   2D Echo 01/20/13 - Left ventricle: The cavity size was normal. There was moderate  concentric hypertrophy. Systolic function was vigorous. The estimated ejection fraction was in the range of 65% to 70%. Wall motion was normal; there were no regional wall motion abnormalities. Features are consistent with a pseudonormal left ventricular filling pattern, with concomitant abnormal relaxation and increased filling pressure (grade 2 diastolic dysfunction). Doppler parameters are consistent with high ventricular filling pressure. - Aortic valve: There was moderate stenosis. Valve area: 1.48cm^2(VTI). Valve area: 1.43cm^2 (Vmax). - Left atrium: The atrium was mildly dilated. - Tricuspid valve: Moderate regurgitation. - Pulmonary arteries: PA peak pressure: 31mm Hg (S).  Discharge Medications     Medication List         ALPRAZolam 0.25 MG tablet  Commonly known as:  XANAX  Take 0.25 mg by mouth at bedtime.     amiodarone 200 MG tablet  Commonly known as:  PACERONE  Take 0.5 tablets (100 mg total) by mouth  daily.     CEREFOLIN NAC 6-2-600 MG Tabs  Take 1 tablet by mouth 2 (two) times daily.     docusate sodium 100 MG capsule  Commonly known as:  COLACE  Take 100 mg by mouth every morning.     ferrous sulfate 325 (65 FE) MG tablet  Take 1 tablet (325 mg total) by mouth 2 (two) times daily with a meal.     fluPHENAZine 2.5 MG tablet  Commonly known as:  PROLIXIN  Take 2.5 mg by mouth at bedtime.     HYDROmorphone HCl 8 MG T24a SR tablet  Commonly known as:  EXALGO  Take 8 mg by mouth daily.     iloperidone 4 MG Tabs tablet  Commonly known as:  FANAPT  Take 4 mg by mouth at bedtime.     LIDODERM 5 %  Generic drug:  lidocaine  Place 1 patch onto the skin as needed. Remove & Discard patch within 12 hours or as directed by MD     lisinopril 10 MG tablet  Commonly known as:  PRINIVIL,ZESTRIL  Take 1 tablet (10 mg total) by mouth daily.     nitroGLYCERIN 0.4 MG SL tablet  Commonly known as:  NITROSTAT  Place 0.4 mg under the tongue every 5 (five) minutes  as needed for chest pain.     OVER THE COUNTER MEDICATION  Take 2 capsules by mouth daily. Cardilast - avacado soy supplement.     oxyCODONE-acetaminophen 5-325 MG per tablet  Commonly known as:  PERCOCET/ROXICET  Take 1 tablet by mouth every 6 (six) hours as needed (for breakthrough pain).     polyethylene glycol packet  Commonly known as:  MIRALAX / GLYCOLAX  Take 17 g by mouth daily as needed for mild constipation.     Rivaroxaban 20 MG Tabs tablet  Commonly known as:  XARELTO  Take 1 tablet (20 mg total) by mouth daily.     senna 8.6 MG tablet  Commonly known as:  SENOKOT  Take 2 tablets by mouth at bedtime.     tetrabenazine 12.5 MG tablet  Commonly known as:  XENAZINE  Take 12.5 mg by mouth 3 (three) times daily.     traZODone 50 MG tablet  Commonly known as:  DESYREL  Take 200 mg by mouth at bedtime.     venlafaxine XR 150 MG 24 hr capsule  Commonly known as:  EFFEXOR-XR  Take 150 mg by mouth daily.        Disposition   The patient will be discharged in stable condition to home. Discharge Orders   Future Appointments Provider Department Dept Phone   01/31/2013 11:30 AM Kennon Rounds [facility] [place] [facility] [phone]   06/30/2013 10:00 AM Rosalio Macadamia, NP [facility] [phone]   Future Orders Complete By Expires   Diet - low sodium heart healthy  As directed    Discharge instructions  As directed    Comments:     Please check your blood pressure occasionally over the next week at home or at the pharmacy, grocery store, etc. Bring a log of your blood pressures to your follow-up appointment.   Increase activity slowly  As directed      Follow-up Information   Follow up with Thora Lance, MD. (Follow up with your primary care doctor or gastroenterologist to discuss further workup of your anemia. They will be the ones to refill your iron supplements if needed.)  Specialty:  Family Medicine   Contact information:    [address], [address] [place] [address] [phone]       Follow up with Tereso Newcomer, PA-C. (01/31/13 at 11:30am)    Specialty:  Physician Assistant   Contact information:   [address] [place] [address] [phone]         Duration of Discharge Encounter: Greater than 30 minutes including physician and PA time.  Signed, Kriste Basque Dunn PA-C 01/20/2013, 5:03 PM  [name] MD

## 2013-01-20 NOTE — Progress Notes (Signed)
Pt IV leaking. IV stopped. Attempt x 1 to insert a new one unsuccessful. IV team paged and made aware. Pharmacy made aware of heparin levels needing to be rescheduled. Will continue to monitor. Levonne Spiller, RN

## 2013-01-20 NOTE — Progress Notes (Signed)
Pt refusing another IV attempt at this time until patient knows whether or not she is going home. Levonne Spiller, RN

## 2013-01-20 NOTE — Progress Notes (Signed)
ANTICOAGULATION CONSULT NOTE - Initial Consult  Pharmacy Consult for heparin Indication: chest pain/ACS  Allergies  Allergen Reactions  . Cleocin [Clindamycin Hcl]     Gi upset  . Demerol     Gi upset  . Erythromycin     Gi upset  . Meperidine Hcl     GI upset  . Penicillins Rash    Patient Measurements: weight 68 kg, height 64 inches Height: 5\' 4"  (162.6 cm) Weight: 150 lb (68.04 kg) IBW/kg (Calculated) : 54.7 Heparin Dosing Weight: 68 kg  Vital Signs: Temp: 97.8 F (36.6 C) (12/29 0550) BP: 136/48 mmHg (12/29 0550) Pulse Rate: 59 (12/29 0550)  Labs:  Recent Labs  01/19/13 1344 01/19/13 2120 01/20/13 0147 01/20/13 0510  HGB 8.2*  --   --  8.3*  HCT 27.7*  --   --  27.9*  PLT 285  --   --  274  APTT  --  31  --  48*  LABPROT  --  13.5  --   --   INR  --  1.05  --   --   HEPARINUNFRC  --  <0.10*  --  0.22*  CREATININE 0.78  --   --  0.64  TROPONINI  --  <0.30 <0.30  --     Estimated Creatinine Clearance: 54.9 ml/min (by C-G formula based on Cr of 0.64).   Medical History: Past Medical History  Diagnosis Date  . Atrial fibrillation     a.03/2009 DCCV;  b. 11/2010 DCCV;  c. maintained on amio 100mg  daily and xarelto.  . Sinus bradycardia   . Edema   . DDD (degenerative disc disease)   . HTN (hypertension)   . Depression, reactive, psychotic   . Mitral regurgitation     a. 03/2010 Echo: EF 60-65%, no rwma, gr 2 dd, mild MR, mild bi-atrial enlargement.  . Chronic pain   . Tardive dyskinesia   . COPD (chronic obstructive pulmonary disease)   . GERD (gastroesophageal reflux disease)   . Anemia   . Chest pain     a. 07/2011 MV: EF 65%, small, mild, fixed distal anterior defect suggestive of soft tissue attenuation, no ischemia.    Medications:  See med rec  Assessment: Patient is a 79 y.o F an xarelto PTA for  Afib.  She presented to [facility] today with c/o of CP.  To start heparin for r/o ACS.  Her daughter reported that the last xarelto dose her mom took  PTA was on 12/27 at 5PM.  Baseline HL < 0.1; this morning up to 0.22 on 800 units/hr of heparin.  PTT also below goal at 48.  Spoke with RN, IV heparin started a little late, but infusing okay overnight > 6 hrs, so level should be accurate.  It appears heparin level not influenced by any recent Xarelto dosing.  Goal of Therapy:  Heparin level 0.3-0.7 units/ml Monitor platelets by anticoagulation protocol: Yes   Plan:  1. Increase IV heparin to 950 units/hr. 2. Recheck heparin level in 6 hrs. 3. Continue daily heparin level and CBC.  Tad Moore, BCPS  Clinical Pharmacist Pager [phone]  01/20/2013 6:31 AM

## 2013-01-20 NOTE — Progress Notes (Signed)
SUBJECTIVE: The patient is doing well today.  She has had no further chest pain.   CURRENT MEDICATIONS: . ALPRAZolam  0.25 mg Oral QHS  . amiodarone  100 mg Oral Daily  . aspirin EC  81 mg Oral Daily  . CEREFOLIN NAC  1 tablet Oral BID  . docusate sodium  100 mg Oral q morning - 10a  . fluPHENAZine  2.5 mg Oral QHS  . HYDROmorphone HCl  8 mg Oral Daily  . iloperidone  4 mg Oral QHS  . lisinopril  10 mg Oral Daily  . senna  2 tablet Oral QHS  . sodium chloride  3 mL Intravenous Q12H  . tetrabenazine  12.5 mg Oral TID  . traZODone  200 mg Oral QHS  . venlafaxine XR  150 mg Oral Daily   . heparin 950 Units/hr (01/20/13 0631)    OBJECTIVE: Physical Exam: Filed Vitals:   01/19/13 1947 01/19/13 2007 01/19/13 2100 01/20/13 0550  BP:   112/48 136/48  Pulse: 65  64 59  Temp:   98.2 F (36.8 C) 97.8 F (36.6 C)  Resp: 17  18 18   Height:   5\' 4"  (1.626 m)   Weight:   150 lb (68.04 kg) 150 lb (68.04 kg)  SpO2: 95% 95% 95% 95%    Intake/Output Summary (Last 24 hours) at 01/20/13 1610 Last data filed at 01/20/13 9604  Gross per 24 hour  Intake      0 ml  Output    900 ml  Net   -900 ml    Telemetry reveals sinus rhythm, no atrial fibrillation  GEN- The patient is well appearing, alert and oriented x 3 today.   Head- normocephalic, atraumatic Eyes-  Sclera clear, conjunctiva pink Ears- hearing intact Oropharynx- clear Neck- supple, no JVP Lymph- no cervical lymphadenopathy Lungs- Clear to ausculation bilaterally, normal work of breathing Heart- Regular rate and rhythm, 2/6 SEM LUSB (mid peaking) GI- soft, NT, ND, + BS Extremities- no clubbing, cyanosis, or edema   LABS: Basic Metabolic Panel:  Recent Labs  54/09/81 1344 01/20/13 0510  NA 133* 139  K 4.5 4.0  CL 99 104  CO2 27 27  GLUCOSE 96 93  BUN 17 14  CREATININE 0.78 0.64  CALCIUM 8.5 8.5   CBC:  Recent Labs  01/19/13 1344 01/20/13 0510  WBC 6.2 4.1  NEUTROABS 4.3  --   HGB 8.2* 8.3*  HCT  27.7* 27.9*  MCV 78.5 78.4  PLT 285 274   Cardiac Enzymes:  Recent Labs  01/19/13 2120 01/20/13 0147  TROPONINI <0.30 <0.30   Fasting Lipid Panel:  Recent Labs  01/20/13 0510  CHOL 172  HDL 89  LDLCALC 76  TRIG 34  CHOLHDL 1.9   RADIOLOGY: Dg Chest 2 View 01/19/2013   CLINICAL DATA:  Chest pain, shortness of breath, former smoker, history hypertension, COPD  EXAM: CHEST  2 VIEW  COMPARISON:  06/26/2011  FINDINGS: Borderline enlargement of cardiac silhouette.  Atherosclerotic calcification aorta.  Mediastinal contours and pulmonary vascularity normal.  Lungs appear hyperaerated with peribronchial thickening and slight flattening of diaphragms question COPD.  No definite acute infiltrate, pleural effusion or pneumothorax.  No acute osseous findings.  IMPRESSION: Probable COPD changes.  No acute abnormalities.   Electronically Signed   By: Ulyses Southward M.D.   On: 01/19/2013 14:32    ASSESSMENT AND PLAN:  Active Problems:   Chest pain  1. Chest pain Both typical and atypical features I agree with  proceeding with myoview at this time If low risk then we will treat medically Continue IV heparin for now. Would add beta blocker post stress test  2. Iron deficiency anemia Outpatient workup is ongoing Add iron at discharge  3. afib Controlled Continue current medicines  4. Murmur Echo pending  5. htn Stable No change required today  Home if low risk myoview

## 2013-01-20 NOTE — Progress Notes (Signed)
Utilization review completed.  

## 2013-01-20 NOTE — Progress Notes (Signed)
Pt and pt grandson provided with dc instructions and education. Verbalized understanding. All questions answered. Iv removed with tip intact. Heart monitor cleaned and returned to front. Levonne Spiller, RN

## 2013-01-23 ENCOUNTER — Other Ambulatory Visit: Payer: Self-pay | Admitting: Internal Medicine

## 2013-01-30 ENCOUNTER — Other Ambulatory Visit: Payer: Self-pay | Admitting: Gastroenterology

## 2013-01-30 DIAGNOSIS — D509 Iron deficiency anemia, unspecified: Secondary | ICD-10-CM

## 2013-01-31 ENCOUNTER — Encounter: Payer: Self-pay | Admitting: Physician Assistant

## 2013-01-31 ENCOUNTER — Ambulatory Visit (INDEPENDENT_AMBULATORY_CARE_PROVIDER_SITE_OTHER): Payer: Medicare HMO | Admitting: Physician Assistant

## 2013-01-31 VITALS — BP 120/70 | HR 62 | Ht 64.0 in | Wt 155.0 lb

## 2013-01-31 DIAGNOSIS — I10 Essential (primary) hypertension: Secondary | ICD-10-CM

## 2013-01-31 DIAGNOSIS — D649 Anemia, unspecified: Secondary | ICD-10-CM

## 2013-01-31 DIAGNOSIS — I5032 Chronic diastolic (congestive) heart failure: Secondary | ICD-10-CM

## 2013-01-31 DIAGNOSIS — I4891 Unspecified atrial fibrillation: Secondary | ICD-10-CM

## 2013-01-31 DIAGNOSIS — I350 Nonrheumatic aortic (valve) stenosis: Secondary | ICD-10-CM | POA: Insufficient documentation

## 2013-01-31 DIAGNOSIS — R079 Chest pain, unspecified: Secondary | ICD-10-CM

## 2013-01-31 DIAGNOSIS — I359 Nonrheumatic aortic valve disorder, unspecified: Secondary | ICD-10-CM

## 2013-01-31 LAB — CBC WITH DIFFERENTIAL/PLATELET
BASOS PCT: 0.5 % (ref 0.0–3.0)
Basophils Absolute: 0 10*3/uL (ref 0.0–0.1)
EOS ABS: 0.2 10*3/uL (ref 0.0–0.7)
Eosinophils Relative: 4.9 % (ref 0.0–5.0)
HEMATOCRIT: 28.8 % — AB (ref 36.0–46.0)
HEMOGLOBIN: 9.3 g/dL — AB (ref 12.0–15.0)
LYMPHS ABS: 1 10*3/uL (ref 0.7–4.0)
LYMPHS PCT: 19.7 % (ref 12.0–46.0)
MCHC: 32.2 g/dL (ref 30.0–36.0)
MCV: 78.6 fl (ref 78.0–100.0)
MONO ABS: 0.5 10*3/uL (ref 0.1–1.0)
Monocytes Relative: 9.2 % (ref 3.0–12.0)
NEUTROS ABS: 3.2 10*3/uL (ref 1.4–7.7)
Neutrophils Relative %: 65.7 % (ref 43.0–77.0)
Platelets: 273 10*3/uL (ref 150.0–400.0)
RBC: 3.67 Mil/uL — ABNORMAL LOW (ref 3.87–5.11)
RDW: 26.5 % — ABNORMAL HIGH (ref 11.5–14.6)
WBC: 4.9 10*3/uL (ref 4.5–10.5)

## 2013-01-31 MED ORDER — FERROUS SULFATE 325 (65 FE) MG PO TABS: 325.0000 mg | ORAL_TABLET | Freq: Two times a day (BID) | ORAL | Status: DC

## 2013-01-31 MED ORDER — PANTOPRAZOLE SODIUM 40 MG PO TBEC: 40.0000 mg | DELAYED_RELEASE_TABLET | Freq: Every day | ORAL | Status: DC

## 2013-01-31 NOTE — Addendum Note (Signed)
Addended by: Tarri FullerFIATO, CAROL M on: 01/31/2013 12:19 PM   Modules accepted: Orders

## 2013-01-31 NOTE — Patient Instructions (Signed)
START PROTONIX 40 MG DAILY  LAB WORK TODAY; CBC W/DIFF  PLEASE FOLLOW UP WITH [place], [facility] 03/06/13 @ 2 PM

## 2013-01-31 NOTE — Progress Notes (Signed)
[address] [place], [place]  [address] Phone: [phone] Fax:  [phone]  Date:  01/31/2013   ID:  Kathryn Smith, DOB 12/20/1932, MRN [id number]  PCP:  Thora LanceEHINGER,ROBERT R, MD  Cardiologist:  Dr. Hillis RangeJames Allred    History of Present Illness: Kathryn Smith is a 80 y.o. female with a hx of atrial fibrillation, status post DCCV in 2012, chronic amiodarone and Xarelto therapy, chronic diastolic CHF, HTN, depression. She had a low risk Myoview in 07/2011. She has recently been seen by Dr. Johney FrameAllred with chest pain. Conservative management was recommended. She was then admitted 12/28-12/29 with chest pain. Cardiac markers remained normal. Echocardiogram (01/20/2013): Moderate LVH, EF 65-70%, normal wall motion, grade 2 diastolic dysfunction, moderate aortic stenosis (mean gradient 22 mm mercury), mild LAE, moderate TR, PASP 31. Inpatient Myoview (01/20/2013): EF 74%, normal perfusion. Blood pressure and heart rate were variable. Therefore beta blocker was not initiated. She has a history of anemia and she was encouraged to follow up with primary care and gastroenterology.  Since discharge, she has had one further episode of chest discomfort. She describes it as pressure with radiation up into her jaw. It occurred at rest. It was relieved by nitroglycerin. She denies associated dyspnea, nausea or diaphoresis. She exercises on a stationary bike twice a week. She rides for about 20 minutes. She denies significant dyspnea or chest discomfort with this. She denies syncope, orthopnea, PND or edema. Of note, her most recent episode of chest discomfort occurred about one hour after eating. In retrospect, her symptoms prior to hospitalization also occurred about one hour after eating.  Recent Labs: 12/23/2012: ALT 11  01/19/2013: TSH 0.669  01/20/2013: Creatinine 0.64; HDL 89; Hemoglobin 8.3*; LDL (calc) 76; Potassium 4.0   Wt Readings from Last 3 Encounters:  01/31/13 155 lb (70.308 kg)  01/20/13  150 lb (68.04 kg)  01/02/13 150 lb (68.04 kg)     Past Medical History  Diagnosis Date  . Atrial fibrillation     a.03/2009 DCCV;  b. 11/2010 DCCV;  c. maintained on amio 100mg  daily and xarelto.  . Sinus bradycardia   . Edema   . DDD (degenerative disc disease)   . HTN (hypertension)   . Depression, reactive, psychotic   . Mitral regurgitation     a. 03/2010 Echo: EF 60-65%, no rwma, gr 2 dd, mild MR, mild bi-atrial enlargement.  . Chronic pain   . Tardive dyskinesia   . COPD (chronic obstructive pulmonary disease)   . GERD (gastroesophageal reflux disease)   . Anemia      S/p egd/colon 12/11 ->no source for anemia found. Capsule enteroscopy vs heme consult being considered.  . Chest pain     a. 07/2011 MV: EF 65%, soft tissue attenuation, no ischemia. b. 12/2012: normal nuc.   Marland Kitchen. Aortic stenosis     a. 12/2012 echo - mild-mod.  . Schizophrenia     Current Outpatient Prescriptions  Medication Sig Dispense Refill  . ALPRAZolam (XANAX) 0.25 MG tablet Take 0.25 mg by mouth at bedtime.       Marland Kitchen. amiodarone (PACERONE) 200 MG tablet Take 0.5 tablets (100 mg total) by mouth daily.  90 tablet  3  . docusate sodium (COLACE) 100 MG capsule Take 100 mg by mouth every morning.      . ferrous sulfate 325 (65 FE) MG tablet Take 1 tablet (325 mg total) by mouth 2 (two) times daily with a meal.  60 tablet  0  . fluPHENAZine (  PROLIXIN) 2.5 MG tablet Take 2.5 mg by mouth at bedtime.       Marland Kitchen HYDROmorphone HCl (EXALGO) 8 MG T24A SR tablet Take 8 mg by mouth daily.      Marland Kitchen iloperidone (FANAPT) 4 MG TABS tablet Take 4 mg by mouth at bedtime.      Marland Kitchen L-Methylfolate-B12-B6-B2 (CEREFOLIN PO) Take by mouth 2 (two) times daily.      Marland Kitchen lidocaine (LIDODERM) 5 % Place 1 patch onto the skin as needed. Remove & Discard patch within 12 hours or as directed by MD      . lisinopril (PRINIVIL,ZESTRIL) 10 MG tablet Take 1 tablet (10 mg total) by mouth daily.  90 tablet  3  . loteprednol (LOTEMAX) 0.2 % SUSP 1 drop as  directed.      . Methylfol-Methylcob-Acetylcyst (CEREFOLIN NAC) 6-2-600 MG TABS Take 1 tablet by mouth 2 (two) times daily.       . nitroGLYCERIN (NITROSTAT) 0.4 MG SL tablet Place 0.4 mg under the tongue every 5 (five) minutes as needed for chest pain.      Marland Kitchen OVER THE COUNTER MEDICATION Take 2 capsules by mouth daily. Cardilast - avacado soy supplement.      Marland Kitchen oxyCODONE-acetaminophen (PERCOCET) 5-325 MG per tablet Take 1 tablet by mouth every 6 (six) hours as needed (for breakthrough pain).       . polyethylene glycol (MIRALAX / GLYCOLAX) packet Take 17 g by mouth daily as needed for mild constipation.       . Psyllium (METAMUCIL PO) Take by mouth.      . senna (SENOKOT) 8.6 MG tablet Take 2 tablets by mouth at bedtime.       Marland Kitchen tetrabenazine (XENAZINE) 12.5 MG tablet Take 12.5 mg by mouth 3 (three) times daily.      . traZODone (DESYREL) 50 MG tablet Take 150 mg by mouth at bedtime.       Marland Kitchen venlafaxine XR (EFFEXOR-XR) 150 MG 24 hr capsule Take 150 mg by mouth daily.      Carlena Hurl 20 MG TABS tablet take 1 tablet by mouth once daily  90 tablet  3   No current facility-administered medications for this visit.    Allergies:   Cleocin; Demerol; Erythromycin; Meperidine hcl; and Penicillins   Social History:  The patient  reports that she quit smoking about 3 years ago. She has never used smokeless tobacco. She reports that she does not drink alcohol or use illicit drugs.   Family History:  The patient's family history includes Alcoholism in her father; Cancer in her sister. There is no history of Arrhythmia.   ROS:  Please see the history of present illness.   She denies dysphagia or odynophagia. She denies melena or hematochezia. She does note symptoms of belching.   All other systems reviewed and negative.   PHYSICAL EXAM: VS:  BP 120/70  Pulse 62  Ht 5\' 4"  (1.626 m)  Wt 155 lb (70.308 kg)  BMI 26.59 kg/m2 Well nourished, well developed, in no acute distress HEENT: normal Neck: no  JVDat 90 Cardiac:  normal S1, S2; RRR; harsh 2/6 systolic murmurat the RUSB Lungs:  clear to auscultation bilaterally, no wheezing, rhonchi or rales Abd: soft, nontender, no hepatomegaly Ext: no edema Skin: warm and dry Neuro:  CNs 2-12 intact, no focal abnormalities noted  EKG:  Sinus bradycardia, HR 58, poor R-wave progression, no significant change     ASSESSMENT AND PLAN:  1. Chest Pain: Typical and atypical features.  She had a recent low risk Myoview. She does not have exertional symptoms. She has had symptoms after eating. She's been evaluated by gastroenterology recently for anemia. Recent endoscopy demonstrated mild gastritis. She does not appear to be a candidate for aggressive invasive evaluation. At this point, I am not convinced that placing her on antianginals is warranted. I have suggested that she try a PPI. I will start Protonix 40 mg daily. 2. Aortic Stenosis:  Moderate by recent echo.  Consider follow up echo in 6-12 mos. 3. Chronic Diastolic CHF:  No evidence of volume excess. 4. Atrial Fibrillation:  Maintaining NSR.  Continue Amiodarone and Xarelto.  Recent LFTs and TSH ok. 5. Hypertension:  She brings in a list from her SNF of her BPs.  They are optimal.  Continue current Rx. 6. Anemia:  She has follow up with GI next week.  She requests another CBC today.  I will obtain this and forward to Dr. Bosie Clos.  7. Disposition: f/u with me in 6 weeks to assess response to PPI.  Consider long acting nitrates if symptoms persist.  Signed, Tereso Newcomer, PA-C  01/31/2013 11:40 AM

## 2013-02-03 ENCOUNTER — Ambulatory Visit
Admission: RE | Admit: 2013-02-03 | Discharge: 2013-02-03 | Disposition: A | Discharge status: Home / Self Care | Payer: Medicare HMO | Source: Ambulatory Visit | Attending: Gastroenterology | Admitting: Gastroenterology

## 2013-02-03 ENCOUNTER — Telehealth: Payer: Self-pay | Admitting: *Deleted

## 2013-02-03 DIAGNOSIS — D509 Iron deficiency anemia, unspecified: Secondary | ICD-10-CM

## 2013-02-03 MED ORDER — IOHEXOL 300 MG/ML  SOLN
100.0000 mL | Freq: Once | INTRAMUSCULAR | Status: AC | PRN
  Administered 2013-02-03: 100 mL via INTRAVENOUS

## 2013-02-03 NOTE — Telephone Encounter (Signed)
lmptcb for lab results 

## 2013-02-03 NOTE — Telephone Encounter (Signed)
pt notified about lab results and to make sure to f/u w/GI Dr. Bosie ClosSchooler. I will fax results to Dr. Merleen NicelySchooler tonight . Pt said thank you

## 2013-02-03 NOTE — Telephone Encounter (Signed)
Follow up     Returning call back to [place].    Test results

## 2013-02-06 ENCOUNTER — Telehealth: Payer: Self-pay | Admitting: Oncology

## 2013-02-06 NOTE — Telephone Encounter (Signed)
C/D 02/06/13 for appt. 02/27/13

## 2013-02-06 NOTE — Telephone Encounter (Signed)
lvom for pt to return call in re to referral.  °

## 2013-02-18 ENCOUNTER — Other Ambulatory Visit: Payer: Self-pay | Admitting: Oncology

## 2013-02-18 DIAGNOSIS — D509 Iron deficiency anemia, unspecified: Secondary | ICD-10-CM

## 2013-02-20 ENCOUNTER — Other Ambulatory Visit: Payer: Self-pay | Admitting: Internal Medicine

## 2013-02-27 ENCOUNTER — Ambulatory Visit (HOSPITAL_BASED_OUTPATIENT_CLINIC_OR_DEPARTMENT_OTHER): Payer: Medicare HMO | Admitting: Oncology

## 2013-02-27 ENCOUNTER — Encounter: Payer: Self-pay | Admitting: Oncology

## 2013-02-27 ENCOUNTER — Telehealth: Payer: Self-pay | Admitting: *Deleted

## 2013-02-27 ENCOUNTER — Telehealth: Payer: Self-pay

## 2013-02-27 ENCOUNTER — Ambulatory Visit: Payer: Medicare HMO

## 2013-02-27 ENCOUNTER — Other Ambulatory Visit (HOSPITAL_BASED_OUTPATIENT_CLINIC_OR_DEPARTMENT_OTHER): Payer: Medicare HMO

## 2013-02-27 VITALS — BP 124/64 | HR 48 | Temp 97.5°F | Ht 64.0 in | Wt 155.1 lb

## 2013-02-27 DIAGNOSIS — Z7901 Long term (current) use of anticoagulants: Secondary | ICD-10-CM

## 2013-02-27 DIAGNOSIS — D509 Iron deficiency anemia, unspecified: Secondary | ICD-10-CM

## 2013-02-27 DIAGNOSIS — I10 Essential (primary) hypertension: Secondary | ICD-10-CM

## 2013-02-27 DIAGNOSIS — Z808 Family history of malignant neoplasm of other organs or systems: Secondary | ICD-10-CM

## 2013-02-27 DIAGNOSIS — Z87891 Personal history of nicotine dependence: Secondary | ICD-10-CM

## 2013-02-27 DIAGNOSIS — J449 Chronic obstructive pulmonary disease, unspecified: Secondary | ICD-10-CM

## 2013-02-27 DIAGNOSIS — I4891 Unspecified atrial fibrillation: Secondary | ICD-10-CM

## 2013-02-27 LAB — COMPREHENSIVE METABOLIC PANEL (CC13)
ALK PHOS: 57 U/L (ref 40–150)
ALT: 9 U/L (ref 0–55)
ANION GAP: 7 meq/L (ref 3–11)
AST: 14 U/L (ref 5–34)
Albumin: 4 g/dL (ref 3.5–5.0)
BUN: 10.6 mg/dL (ref 7.0–26.0)
CO2: 28 mEq/L (ref 22–29)
Calcium: 9.4 mg/dL (ref 8.4–10.4)
Chloride: 100 mEq/L (ref 98–109)
Creatinine: 0.7 mg/dL (ref 0.6–1.1)
Glucose: 106 mg/dl (ref 70–140)
Potassium: 4.6 mEq/L (ref 3.5–5.1)
SODIUM: 135 meq/L — AB (ref 136–145)
TOTAL PROTEIN: 6.6 g/dL (ref 6.4–8.3)
Total Bilirubin: 0.21 mg/dL (ref 0.20–1.20)

## 2013-02-27 LAB — CBC & DIFF AND RETIC
BASO%: 0.5 % (ref 0.0–2.0)
Basophils Absolute: 0 10*3/uL (ref 0.0–0.1)
EOS ABS: 0.2 10*3/uL (ref 0.0–0.5)
EOS%: 3.3 % (ref 0.0–7.0)
HCT: 34.2 % — ABNORMAL LOW (ref 34.8–46.6)
HGB: 10.7 g/dL — ABNORMAL LOW (ref 11.6–15.9)
IMMATURE RETIC FRACT: 7 % (ref 1.60–10.00)
LYMPH#: 1.3 10*3/uL (ref 0.9–3.3)
LYMPH%: 23.5 % (ref 14.0–49.7)
MCH: 27.4 pg (ref 25.1–34.0)
MCHC: 31.3 g/dL — AB (ref 31.5–36.0)
MCV: 87.7 fL (ref 79.5–101.0)
MONO#: 0.5 10*3/uL (ref 0.1–0.9)
MONO%: 8.5 % (ref 0.0–14.0)
NEUT%: 64.2 % (ref 38.4–76.8)
NEUTROS ABS: 3.6 10*3/uL (ref 1.5–6.5)
Platelets: 214 10*3/uL (ref 145–400)
RBC: 3.9 10*6/uL (ref 3.70–5.45)
RDW: 22.9 % — AB (ref 11.2–14.5)
RETIC %: 1.62 % (ref 0.70–2.10)
Retic Ct Abs: 63.18 10*3/uL (ref 33.70–90.70)
WBC: 5.5 10*3/uL (ref 3.9–10.3)
nRBC: 0 % (ref 0–0)

## 2013-02-27 LAB — URINALYSIS, MICROSCOPIC - CHCC
Bilirubin (Urine): NEGATIVE
GLUCOSE UR CHCC: NEGATIVE mg/dL
Ketones: NEGATIVE mg/dL
Leukocyte Esterase: NEGATIVE
Nitrite: NEGATIVE
Protein: NEGATIVE mg/dL
SPECIFIC GRAVITY, URINE: 1.01 (ref 1.003–1.035)
UROBILINOGEN UR: 0.2 mg/dL (ref 0.2–1)
WBC UA: NEGATIVE (ref 0–2)
pH: 6.5 (ref 4.6–8.0)

## 2013-02-27 NOTE — Telephone Encounter (Signed)
appts made and printed...td 

## 2013-02-27 NOTE — Progress Notes (Signed)
[facility] NEW PATIENT EVALUATION   Name: Kathryn Smith Date: 02/27/2013 MRN: [id number] DOB: [birth year]  REFERRING PHYSICIAN: Charlott Rakes CC: Kathryn Smith, Kathryn Smith, Kathryn Smith, Kathryn Smith [facility]), Sig Gould   REASON FOR REFERRAL: iron deficiency anemia   HISTORY OF PRESENT ILLNESS:Kathryn Smith is a 80 y.o. female who is seen in new pateint consultation, together with daughter, at the request of Dr Bosie Clos, with recently noted anemia. 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  Patient was found to be anemic on CBC done by cardiology 12-23-2012,done in follow up of chronic anticoagulation for atrial fibrillation. Patient was not aware of obvious symptoms from the anemia, tho she has limited activity and several comorbid conditions. Hemoglobin on that CBC in early Dec was 7.7 with MCV 73.8, WBC 5.7 and platelets 317k. B12 was >2000, folate >20, serum iron 13, %sat 3, ferritin 4 and reticulocytes 1.3% on 12-26-12. She was transfused 1 unit PRBCs on 12-26-12, does not recall feeling noticeably better with PRBCs. She was referred to Dr Bosie Clos, with EGD and colonoscopy 01-02-13 negative with exception of mild gastritis and small internal hemorrhoids. Dr Marge Duncans note mentions consideration of capsule endoscopy, tho I do not believe this has been done. Repeat CBC 01-19-13 had Hgb 8.2, WBC 6.2, plt 274k. She was briefly hospitalized for chest pain late Dec 2014, begun on ferrous sulfate bid with meals then. She has tolerated the ferrous sulfate this way with no difficulty. She had CT AP enterography 02-03-13 by Dr Bosie Clos with normal spleen, no adenopathy, no bone lesions suggesting malignancy, kidneys and bladder normal with exception of 5 mm area inferior pole left kidney unclear etiology. Patient denies any overt bleeding in past year or longer, even with  initial coumadin and now xarelto. She "feels sleepy", possibly medication- related including newest xenazine for tremor and chronic pain meds), but does not describe worse SOB with exertion or recent chest pain. She is ambulatory with rolling walker due to chronic back and leg pain, able to walk length of halls in nursing facility where she resides, as she has done previously. She has never donated blood and is not vegetarian/ vegan.    REVIEW OF SYSTEMS as above, also: No recent infectious illness. No HA. Good visual acuity since bilateral cataract surgery. Minimal environmental allergies. Has dentures. No thyroid history (TSH 0.82 in Dec 2014). She has chronic difficulty with balance. She eats regular diet, weight in usual Smith. Last mammograms ~ 2010, no breast concerns. Arthritis neck, knees in addition to scoliosis/ spinal stenosis/ chronic back pain. No hx blood clots. No pedal edema. Past GERD, does not tolerate OJ. No change in bowel habits. No abdominal or pelvic pain.   Remainder of full 10 point review of systems negative.   ALLERGIES: Cleocin; Demerol; Erythromycin; Meperidine hcl; and Penicillins  PAST MEDICAL/ SURGICAL HISTORY:    Back surgery 1976, 1978 Right inguinal hernia repair Bilateral cataract extraction 2014 Spinal stenosis and scoliosis GERD Csection COPD, 40 years tobacco DCd 2011 Tremor Atrial fibrillation treated with cardioversion x2, aortic stenosis, HTN  CURRENT MEDICATIONS: reviewed as listed now in EMR, including ferrous sulfate bid with food. She will try taking the ferrous sulfate on empty stomach with Vit C tablet bid; if she does not tolerate, could go back to ferrous sulfate with food as counts are improving even with this administration, or could try ferrous fumarate  or gluconate also on empty stomach with Vit C.  PHARMACY [facility] [place]   SOCIAL HISTORY:  Originally from [place], [place], in [place] x 4 years as son and daughter both in  [place]. 4 grandchildren ages 44-26. 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 Smoked x 40 years, until moving to [place] 4 yrs ago. Lives at [facility] facility with husband, who is on memory unit.  Daughter also Charity fundraiser.  FAMILY HISTORY:  Colon cancer in sister age 99 Colon cancer paternal grandmother Father ETOH Mother mental illness       PHYSICAL EXAM:  height is 5\' 4"  (1.626 m) and weight is 155 lb 1.6 oz (70.353 kg). Her oral temperature is 97.5 F (36.4 C). Her blood pressure is 124/64 and her pulse is 48.  Alert, pleasant, cooperative, rather frail appearing lady, looks stated age. Hard of hearing but follows loud conversation well. Walks with rolling walker, NAD.  HEENT: PERRL, not icteric. Dental plates, oral mucosa moist. Mucous membranes not markedly pale.  RESPIRATORY: Respirations not labored RA. Lungs clear to A and P  CARDIAC/ VASCULAR: regular rate and rhythm, 3/6 systolic murmur across precordium, clear heart sounds.  ABDOMEN: soft, nontender, normal bowel sounds, no appreciable HSM  LYMPH NODES: no cervical, supraclavicular, axillary adenopathy  BREASTS: bilaterally without dominant mass, skin or nipple findings.  NEUROLOGIC:decreased hearing, marked resting and intention tremor bilateral UE  SKIN: no rash, ecchymoses  MUSCULOSKELETAL: decreased muscle mass thruout. No edema, cords, tenderness, clubbing. Back not tender, with scoliosis evident.    LABORATORY DATA:  Results for orders placed in visit on 02/27/13 (from the past 48 hour(s))  CBC & DIFF AND RETIC     Status: Abnormal   Collection Time    02/27/13 10:57 AM      Result Value Smith   WBC 5.5  3.9 - 10.3 10e3/uL   NEUT# 3.6  1.5 - 6.5 10e3/uL   HGB 10.7 (*) 11.6 - 15.9 g/dL   HCT 16.1 (*) 09.6 - 04.5 %   Platelets 214  145 - 400 10e3/uL   MCV 87.7  79.5 - 101.0 fL   MCH 27.4  25.1 - 34.0 pg   MCHC 31.3 (*) 31.5 - 36.0 g/dL   RBC 4.09  8.11 - 9.14  10e6/uL   RDW 22.9 (*) 11.2 - 14.5 %   lymph# 1.3  0.9 - 3.3 10e3/uL   MONO# 0.5  0.1 - 0.9 10e3/uL   Eosinophils Absolute 0.2  0.0 - 0.5 10e3/uL   Basophils Absolute 0.0  0.0 - 0.1 10e3/uL   NEUT% 64.2  38.4 - 76.8 %   LYMPH% 23.5  14.0 - 49.7 %   MONO% 8.5  0.0 - 14.0 %   EOS% 3.3  0.0 - 7.0 %   BASO% 0.5  0.0 - 2.0 %   nRBC 0  0 - 0 %   Retic % 1.62  0.70 - 2.10 %   Retic Ct Abs 63.18  33.70 - 90.70 10e3/uL   Immature Retic Fract 7.00  1.60 - 10.00 %  COMPREHENSIVE METABOLIC PANEL (CC13)     Status: Abnormal   Collection Time    02/27/13 10:57 AM      Result Value Smith   Sodium 135 (*) 136 - 145 mEq/L   Potassium 4.6  3.5 - 5.1 mEq/L   Chloride 100  98 - 109 mEq/L   CO2 28  22 - 29 mEq/L   Glucose 106  70 - 140 mg/dl   BUN 65.7  7.0 - 84.6 mg/dL   Creatinine 0.7  0.6 - 1.1 mg/dL   Total Bilirubin 9.62  0.20 - 1.20 mg/dL   Alkaline Phosphatase 57  40 - 150 U/L   AST 14  5 - 34 U/L   ALT 9  0 - 55 U/L   Total Protein 6.6  6.4 - 8.3 g/dL   Albumin 4.0  3.5 - 5.0 g/dL   Calcium 9.4  8.4 - 95.2 mg/dL   Anion Gap 7  3 - 11 mEq/L  URINALYSIS, MICROSCOPIC - [facility]     Status: None   Collection Time    02/27/13 10:57 AM      Result Value Smith   Glucose Negative  Negative mg/dL   Comment: Note: New unit of measure   Bilirubin (Urine) Negative  Negative   Ketones Negative  Negative mg/dL   Specific Gravity, Urine 1.010  1.003 - 1.035   Blood Trace  Negative   pH 6.5  4.6 - 8.0   Protein Negative  Negative- <30 mg/dL   Urobilinogen, UR 0.2  0.2 - 1 mg/dL   Nitrite Negative  Negative   Leukocyte Esterase Negative  Negative   RBC / HPF 0-2  0 - 2   WBC, UA Negative  0 - 2   Bacteria, UA Trace  Negative- Trace   Epithelial Cells Occasional  Negative- Few      We have discussed all labs above and reviewed serial CBCs in EMR back to 12-23-2012. Hemoglobin increased ~ a gram as expected with 1 unit PRBCs in early Dec, from 7.5 to ~ 8.3. She has had further increase in Hgb, from  9.3 in early Jan 2015 to 10.7 today. MCV was 73.8 in early Dec and is up to 87.7 now   PATHOLOGY:none   RADIOGRAPHY:  CT ABDOMEN AND PELVIS WITH CONTRAST (ENTEROGRAPHY)  COMPARISON: 03/12/2009  FINDINGS:  Stable right middle lobe subpleural nodule measuring 4 mm, image 6/  series 102. Right lower lobe nodule is also unchanged measuring 4  mm, image 5/ series 102. There is no pleural or pericardial  effusion.  There is no focal liver abnormality identified. The gallbladder  appears normal. There is mild intrahepatic bile duct dilatation. The  common bile duct is increased in caliber measuring up to 9 mm, image  79/series 400. This is similar to the previous exam. No obstructing  stone or mass identified. The pancreas is unremarkable. Normal  appearance of the spleen.  The adrenal glands are both normal. Normal appearance of the right  kidney. There is a small hypodense structure within the inferior  pole of the left kidney which measures 5 mm, image 67/series 2.  The urinary bladder appears normal. Calcified atherosclerotic  disease affects the abdominal aorta and its branches. There is no  upper abdominal adenopathy. No pelvic or inguinal adenopathy.  The stomach appears normal. The small bowel loops have a normal  course and caliber. No evidence for bowel obstruction. No abnormal  enhancing filling defects identified within the lumen of the small  bowel. There is a moderate stool burden identified throughout the  colon. No obstructing mass identified.  Review of the visualized osseous structures is significant for  osteopenia. There is a scoliosis deformity involving the thoracic  and lumbar spine which is convex towards the left. Multi level disc  space narrowing and endplate spurring is noted consistent with  degenerative disc disease.  IMPRESSION:  1.  No findings identified within the small bowel loops to account  for the patient's low hemoglobin.  2. Chronic dilatation of  the common bile duct. No obstructing stone  identified.     DISCUSSION: Etiology of the iron deficiency is not clear from all of information above, so hopefully this was some self limited problem which will not be of concern after iron is repleted. As she is improving with oral iron and not obviously symptomatic, does not need IV iron now. She will try optimizing iron administration for better absorption. She should have repeat CBC in ~ 2 months as long as clinically stable or improving, which she would like to coordinate with Dr Randel BooksEhinger's next visit. She should have repeat iron studies after ~ 4-6 months of the oral iron, which would be best after she has been off oral iron for a week or so. I am glad to see her back or to speak with other MDs at any time if I can be of further help, but have not made scheduled follow up here.     IMPRESSION / PLAN:  1.iron deficiency anemia: seems to have begun between June and Dec 2013, with hemoglobin down to 7.5 by early Dec 2014. No obvious ongoing blood loss and hemoglobin is improving since oral iron started late Dec 2014. Continue oral iron as above, repeat CBC by PCP in ~ 2 mo if otherwise stable, repeat iron stores after hemoglobin normalized to be sure recovered adequately to stop iron then. Prn follow up this office 2.sister with colon cancer: upper and lower endoscopy 2014 without findings of concern 3.spinal stenosis, scoliosis, DJD and chronic back pain 4. Tremor 5.atrial fibrillation, HTN, aortic stenosis. On xarelto for afib 6.long past tobacco with COPD 7.flu vaccine done 8.hard of hearing    Patient and daughter have had questions answered to their satisfaction and are in agreement with plan above. They can contact this office for questions or concerns.  Time spent  40 min, including >50% discussion and coordination of care.  No consent necessary for this encounter. No chemotherapy or antiemetics necessary for this  encounter.  Kholton Coate P, MD 02/27/2013 12:19 PM

## 2013-02-27 NOTE — Progress Notes (Signed)
Checked in new pt with no financial concerns. °

## 2013-02-27 NOTE — Patient Instructions (Addendum)
Best way to absorb iron is to take it on empty stomach with Vitamin C tablet (or with orange juice), so at least an hour before meals or 2 hours after meals. The ferrous sulfate sometimes causes stomach upset. If so, the over the counter ferrous fumarate or ferrous gluconate are often easier to tolerate. You have to ask pharmacist for these other types of iron, tho they are over the counter. Slow release iron preparations are just about impossible to absorb, so don't use those. (name brand Hemocyte is ferrous fumarate, sometimes is covered by insurance)  Dr Darrold SpanLivesay is glad to see you back at any time if you or other doctors request. You should have CBC rechecked in ~ 2 months as long as you are feeling no worse. You should continue the good doses of iron at least 4-6 months, then have iron studies rechecked after you have been off of the iron for a week or so.

## 2013-02-27 NOTE — Telephone Encounter (Signed)
Ms. Kathryn Smith called to request the after visit summary be mailed to her mother's address listed in her chart.  They did not receive this after there visit today. Printed AVS alone with Patient Instructions and mailed to patient's address.

## 2013-03-06 ENCOUNTER — Encounter: Payer: Self-pay | Admitting: Physician Assistant

## 2013-03-06 ENCOUNTER — Ambulatory Visit (INDEPENDENT_AMBULATORY_CARE_PROVIDER_SITE_OTHER): Payer: Medicare HMO | Admitting: Physician Assistant

## 2013-03-06 VITALS — BP 98/50 | HR 64 | Ht 64.0 in | Wt 154.0 lb

## 2013-03-06 DIAGNOSIS — I350 Nonrheumatic aortic (valve) stenosis: Secondary | ICD-10-CM

## 2013-03-06 DIAGNOSIS — I4891 Unspecified atrial fibrillation: Secondary | ICD-10-CM

## 2013-03-06 DIAGNOSIS — I359 Nonrheumatic aortic valve disorder, unspecified: Secondary | ICD-10-CM

## 2013-03-06 DIAGNOSIS — I5032 Chronic diastolic (congestive) heart failure: Secondary | ICD-10-CM

## 2013-03-06 DIAGNOSIS — R079 Chest pain, unspecified: Secondary | ICD-10-CM

## 2013-03-06 DIAGNOSIS — I10 Essential (primary) hypertension: Secondary | ICD-10-CM

## 2013-03-06 MED ORDER — FERROUS SULFATE 325 (65 FE) MG PO TABS: 325.0000 mg | ORAL_TABLET | Freq: Two times a day (BID) | ORAL | Status: DC

## 2013-03-06 NOTE — Progress Notes (Signed)
[address] [place], [place]  [address] Phone: [phone] Fax:  [phone]  Date:  03/06/2013   ID:  Kathryn Badderrene Sanjose, DOB 08/18/1932, MRN [id number]  PCP:  Thora LanceEHINGER,ROBERT R, MD  Cardiologist:  Dr. Hillis RangeJames Allred    History of Present Illness: Kathryn Smith is a 80 y.o. female with a hx of atrial fibrillation, status post DCCV in 2012, chronic amiodarone and Xarelto therapy, chronic diastolic CHF, HTN, depression. She had a low risk Myoview in 07/2011. She has recently been seen by Dr. Johney FrameAllred with chest pain. Conservative management was recommended. She was then admitted 12/28-12/29 with chest pain. Cardiac markers remained normal. Echocardiogram (01/20/2013): Moderate LVH, EF 65-70%, normal wall motion, grade 2 diastolic dysfunction, moderate aortic stenosis (mean gradient 22 mm mercury), mild LAE, moderate TR, PASP 31. Inpatient Myoview (01/20/2013): EF 74%, normal perfusion. Blood pressure and heart rate were variable. Therefore beta blocker was not initiated. She has a history of anemia and she was encouraged to follow up with primary care and gastroenterology.  I saw her in follow up 01/31/13. I felt her symptoms of chest pain were likely related to GERD. She did have a recent EGD with mild gastritis. I placed her on PPI.  She has been seeing GI for anemia and recently saw hematology.  She remains on Fe Rx.  She has had one more episode of chest pain with radiation to her jaw.  This was relieved by NTG.  She has had a few episodes of chest pressure.  These occur at rest.  No associated nausea, dyspnea, diaphoresis.  She exercises on her stationary bike several times a week for 2-3 miles without chest pain.  No significant dyspnea.  No syncope.  No orthopnea, PND, edema.   Recent Labs: 01/19/2013: TSH 0.669  01/20/2013: HDL Cholesterol 89; LDL (calc) 76  02/27/2013: ALT 9; Creatinine 0.7; Hemoglobin 10.7*; Potassium 4.6   Wt Readings from Last 3 Encounters:  03/06/13 154 lb (69.854  kg)  02/27/13 155 lb 1.6 oz (70.353 kg)  01/31/13 155 lb (70.308 kg)     Past Medical History  Diagnosis Date  . Atrial fibrillation     a.03/2009 DCCV;  b. 11/2010 DCCV;  c. maintained on amio 100mg  daily and xarelto.  . Sinus bradycardia   . Edema   . DDD (degenerative disc disease)   . HTN (hypertension)   . Depression, reactive, psychotic   . Mitral regurgitation     a. 03/2010 Echo: EF 60-65%, no rwma, gr 2 dd, mild MR, mild bi-atrial enlargement.  . Chronic pain   . Tardive dyskinesia   . COPD (chronic obstructive pulmonary disease)   . GERD (gastroesophageal reflux disease)   . Anemia      S/p egd/colon 12/11 ->no source for anemia found. Capsule enteroscopy vs heme consult being considered.  . Chest pain     a. 07/2011 MV: EF 65%, soft tissue attenuation, no ischemia. b. 12/2012: normal nuc.   Marland Kitchen. Aortic stenosis     a. 12/2012 echo - mild-mod.  . Schizophrenia     Current Outpatient Prescriptions  Medication Sig Dispense Refill  . ALPRAZolam (XANAX) 0.25 MG tablet Take 0.25 mg by mouth at bedtime.       Marland Kitchen. amiodarone (PACERONE) 200 MG tablet Take 0.5 tablets (100 mg total) by mouth daily.  45 tablet  0  . docusate sodium (COLACE) 100 MG capsule Take 100 mg by mouth every morning.      . ferrous sulfate  325 (65 FE) MG tablet Take 325 mg by mouth 2 (two) times daily with a meal. Take before meals with vitamin C      . fluPHENAZine (PROLIXIN) 2.5 MG tablet Take 2.5 mg by mouth at bedtime.       Marland Kitchen HYDROmorphone HCl (EXALGO) 8 MG T24A SR tablet Take 8 mg by mouth daily.      Marland Kitchen iloperidone (FANAPT) 4 MG TABS tablet Take 4 mg by mouth at bedtime.      Marland Kitchen L-Methylfolate-B12-B6-B2 (CEREFOLIN PO) Take by mouth 2 (two) times daily.      Marland Kitchen lidocaine (LIDODERM) 5 % Place 1 patch onto the skin as needed. Remove & Discard patch within 12 hours or as directed by MD      . lisinopril (PRINIVIL,ZESTRIL) 10 MG tablet Take 1 tablet (10 mg total) by mouth daily.  90 tablet  3  .  Methylfol-Methylcob-Acetylcyst (CEREFOLIN NAC) 6-2-600 MG TABS Take 1 tablet by mouth 2 (two) times daily.       . nitroGLYCERIN (NITROSTAT) 0.4 MG SL tablet Place 0.4 mg under the tongue every 5 (five) minutes as needed for chest pain.      Marland Kitchen OVER THE COUNTER MEDICATION Take 2 capsules by mouth daily. Cardilast - avacado soy supplement.      Marland Kitchen oxyCODONE-acetaminophen (PERCOCET) 5-325 MG per tablet Take 1 tablet by mouth every 6 (six) hours as needed (for breakthrough pain).       . pantoprazole (PROTONIX) 40 MG tablet Take 1 tablet (40 mg total) by mouth daily.  30 tablet  2  . polyethylene glycol (MIRALAX / GLYCOLAX) packet Take 17 g by mouth daily as needed for mild constipation.       . Psyllium (METAMUCIL PO) Take by mouth.      . senna (SENOKOT) 8.6 MG tablet Take 2 tablets by mouth at bedtime.       Marland Kitchen tetrabenazine (XENAZINE) 12.5 MG tablet Take 12.5 mg by mouth 3 (three) times daily.      . traZODone (DESYREL) 50 MG tablet Take 150 mg by mouth at bedtime.       Marland Kitchen venlafaxine XR (EFFEXOR-XR) 150 MG 24 hr capsule Take 150 mg by mouth daily.      Carlena Hurl 20 MG TABS tablet take 1 tablet by mouth once daily  90 tablet  3   No current facility-administered medications for this visit.    Allergies:   Cleocin; Demerol; Erythromycin; Meperidine hcl; and Penicillins   Social History:  The patient  reports that she quit smoking about 3 years ago. She has never used smokeless tobacco. She reports that she does not drink alcohol or use illicit drugs.   Family History:  The patient's family history includes Alcoholism in her father; Cancer in her sister. There is no history of Arrhythmia.   ROS:  Please see the history of present illness.   She had a recent URI that is resolving.    All other systems reviewed and negative.   PHYSICAL EXAM: VS:  BP 98/50  Pulse 64  Ht 5\' 4"  (1.626 m)  Wt 154 lb (69.854 kg)  BMI 26.42 kg/m2 Well nourished, well developed, in no acute distress HEENT:  normal Neck: no JVDat 90 Cardiac:  normal S1, S2; RRR; harsh 2/6 systolic murmurat the RUSB Lungs:  clear to auscultation bilaterally, no wheezing, rhonchi or rales Abd: soft, nontender, no hepatomegaly Ext: no edema Skin: warm and dry Neuro:  CNs 2-12 intact, no focal abnormalities  noted  EKG:   NSR, HR 64, rightward axis, NSSTTW changes    ASSESSMENT AND PLAN:  1. Chest Pain:  There has been some improvement.  However, she continues to have symptoms.  These are atypical.  She has no exertional symptoms.  Continue Protonix 40 mg daily.  I do not think her BP is high enough to tolerate nitrates.  Continue prn NTG.  If her symptoms become more frequent, consider d/c Lisinopril and starting Isosorbide.   2. Aortic Stenosis:   Moderate by recent echo.  Follow up echo in 12 mos. 3. Chronic Diastolic CHF:  No evidence of volume excess. 4. Atrial Fibrillation:  Maintaining NSR.  Continue Amiodarone and Xarelto.  Recent LFTs and TSH ok. 5. Hypertension:  Continue current Rx. 6. Anemia:  Followed by GI and Hematology.   7. Disposition:  F/u with Norma Fredrickson, NP in 06/2013 as planned.   Signed, Tereso Newcomer, PA-C  03/06/2013 2:19 PM

## 2013-03-06 NOTE — Patient Instructions (Signed)
Your physician recommends that you continue on your current medications as directed. Please refer to the Current Medication list given to you today.  I sent your refill into your pharmacy as requested.  Your physician has requested that you have an echocardiogram. Echocardiography is a painless test that uses sound waves to create images of your heart. It provides your doctor with information about the size and shape of your heart and how well your heart's chambers and valves are working. This procedure takes approximately one hour. There are no restrictions for this procedure.( Schedule one year from now)

## 2013-03-10 ENCOUNTER — Other Ambulatory Visit: Payer: Self-pay | Admitting: Internal Medicine

## 2013-04-20 ENCOUNTER — Other Ambulatory Visit: Payer: Self-pay | Admitting: Physician Assistant

## 2013-05-17 ENCOUNTER — Other Ambulatory Visit: Payer: Self-pay | Admitting: Internal Medicine

## 2013-05-26 ENCOUNTER — Other Ambulatory Visit: Payer: Self-pay | Admitting: Internal Medicine

## 2013-05-29 NOTE — Telephone Encounter (Signed)
Closing encounter

## 2013-06-30 ENCOUNTER — Encounter: Payer: Self-pay | Admitting: Nurse Practitioner

## 2013-06-30 ENCOUNTER — Ambulatory Visit (INDEPENDENT_AMBULATORY_CARE_PROVIDER_SITE_OTHER): Payer: Medicare HMO | Admitting: Nurse Practitioner

## 2013-06-30 VITALS — BP 90/60 | HR 58 | Ht 64.0 in | Wt 153.1 lb

## 2013-06-30 DIAGNOSIS — I359 Nonrheumatic aortic valve disorder, unspecified: Secondary | ICD-10-CM

## 2013-06-30 DIAGNOSIS — I4891 Unspecified atrial fibrillation: Secondary | ICD-10-CM

## 2013-06-30 DIAGNOSIS — I350 Nonrheumatic aortic (valve) stenosis: Secondary | ICD-10-CM

## 2013-06-30 DIAGNOSIS — I5032 Chronic diastolic (congestive) heart failure: Secondary | ICD-10-CM

## 2013-06-30 DIAGNOSIS — I10 Essential (primary) hypertension: Secondary | ICD-10-CM

## 2013-06-30 LAB — BASIC METABOLIC PANEL
BUN: 11 mg/dL (ref 6–23)
CO2: 28 mEq/L (ref 19–32)
Calcium: 9.2 mg/dL (ref 8.4–10.5)
Chloride: 101 mEq/L (ref 96–112)
Creatinine, Ser: 0.6 mg/dL (ref 0.4–1.2)
GFR: 102.48 mL/min (ref >60.00)
Glucose, Bld: 86 mg/dL (ref 70–99)
Potassium: 4.6 mEq/L (ref 3.5–5.1)
Sodium: 136 mEq/L (ref 135–145)

## 2013-06-30 LAB — CBC
HCT: 35.8 % — ABNORMAL LOW (ref 36.0–46.0)
Hemoglobin: 11.7 g/dL — ABNORMAL LOW (ref 12.0–15.0)
MCHC: 32.7 g/dL (ref 30.0–36.0)
MCV: 96.8 fl (ref 78.0–100.0)
Platelets: 208 10*3/uL (ref 150.0–400.0)
RBC: 3.7 Mil/uL — ABNORMAL LOW (ref 3.87–5.11)
RDW: 14.2 % (ref 11.5–15.5)
WBC: 5.8 10*3/uL (ref 4.0–10.5)

## 2013-06-30 LAB — HEPATIC FUNCTION PANEL
ALT: 9 U/L (ref 0–35)
AST: 14 U/L (ref 0–37)
Albumin: 3.7 g/dL (ref 3.5–5.2)
Alkaline Phosphatase: 59 U/L (ref 39–117)
Bilirubin, Direct: 0.1 mg/dL (ref 0.0–0.3)
Total Bilirubin: 0.5 mg/dL (ref 0.2–1.2)
Total Protein: 6.1 g/dL (ref 6.0–8.3)

## 2013-06-30 LAB — LIPID PANEL
Cholesterol: 183 mg/dL (ref 0–200)
HDL: 68.2 mg/dL (ref >39.00)
LDL Cholesterol: 108 mg/dL — ABNORMAL HIGH (ref 0–99)
NonHDL: 114.8
Total CHOL/HDL Ratio: 3
Triglycerides: 33 mg/dL (ref 0.0–149.0)
VLDL: 6.6 mg/dL (ref 0.0–40.0)

## 2013-06-30 LAB — TSH: TSH: 0.67 u[IU]/mL (ref 0.35–4.50)

## 2013-06-30 NOTE — Progress Notes (Signed)
Kathryn BadderIrene Smith Date of Birth: 09/02/1932 Medical Record #[id number]  History of Present Illness: Kathryn Smith is seen back today for a 4 month check - seen for Dr. Johney FrameAllred. She has multiple medical problems. These include atrial fibrillation, prior cardioversion in 2012, chronic Amiodarone therapy, chronic anti-coagulation with Xarelto, chronic diastolic heart failure, hypertension, and depression. She was admitted with chest pain at the later part of December. Her last Myoview was in December of 2014 which showed normal perfusion and normal ejection fraction. Her last echocardiogram in December of 2014 showed moderate LVH, normal ejection fraction, grade 2 diastolic dysfunction and moderate aortic stenosis.  She was last seen here in February by Kathryn NewcomerScott Weaver PA - she was noted to continue to be having chest discomfort which seemed more GI related. She was otherwise doing okay. She was in sinus rhythm.  She comes in today. She is here with her daughter. She says she is doing well. She denies any chest pain, shortness of breath or syncope. She does use a walker for ambulation. No falls reported. Her daughter does note that they did not get a recheck of her CBC in March as planned. She has had colonoscopy within the last year. She has seen hematology for her anemia. She is on iron therapy. Overall she is felt to be doing okay from our standpoint. Blood pressure is a little low today and she is totally asymptomatic.   Current Outpatient Prescriptions  Medication Sig Dispense Refill  . Alpha Lipoic Acid 200 MG CAPS Take 200 mg by mouth 3 (three) times daily.      Marland Kitchen. ALPRAZolam (XANAX) 0.25 MG tablet Take 0.5 mg by mouth at bedtime.       Marland Kitchen. amiodarone (PACERONE) 200 MG tablet take 1/2 tablet by mouth once daily  45 tablet  0  . docusate sodium (COLACE) 100 MG capsule Take 100 mg by mouth every morning.      . ferrous sulfate 325 (65 FE) MG tablet Take 1 tablet (325 mg total) by mouth 2 (two) times daily  with a meal. Take before meals with vitamin C  60 tablet  11  . fluPHENAZine (PROLIXIN) 2.5 MG tablet Take 2.5 mg by mouth at bedtime.       Marland Kitchen. HYDROmorphone HCl (EXALGO) 8 MG T24A SR tablet Take 8 mg by mouth daily.      Marland Kitchen. iloperidone (FANAPT) 4 MG TABS tablet Take 4 mg by mouth at bedtime.      . lidocaine (LIDODERM) 5 % Place 1 patch onto the skin as needed. Remove & Discard patch within 12 hours or as directed by MD      . lisinopril (PRINIVIL,ZESTRIL) 10 MG tablet take 1 tablet by mouth once daily  90 tablet  3  . Methylfol-Methylcob-Acetylcyst (CEREFOLIN NAC) 6-2-600 MG TABS Take 1 tablet by mouth 2 (two) times daily.       . nitroGLYCERIN (NITROSTAT) 0.4 MG SL tablet Place 0.4 mg under the tongue every 5 (five) minutes as needed for chest pain.      Marland Kitchen. OVER THE COUNTER MEDICATION Take 2 capsules by mouth daily. Cardilast - avacado soy supplement.      Marland Kitchen. oxyCODONE-acetaminophen (PERCOCET) 5-325 MG per tablet Take 1 tablet by mouth every 6 (six) hours as needed (for breakthrough pain).       . pantoprazole (PROTONIX) 40 MG tablet take 1 tablet by mouth once daily  30 tablet  6  . polyethylene glycol (MIRALAX / GLYCOLAX) packet Take 17  g by mouth daily as needed for mild constipation.       . Psyllium (METAMUCIL PO) Take by mouth.      . senna (SENOKOT) 8.6 MG tablet Take 2 tablets by mouth at bedtime.       . traZODone (DESYREL) 50 MG tablet Take 150 mg by mouth at bedtime.       Marland Kitchen venlafaxine XR (EFFEXOR-XR) 150 MG 24 hr capsule Take 150 mg by mouth daily.      Carlena Hurl 20 MG TABS tablet take 1 tablet by mouth once daily  90 tablet  3   No current facility-administered medications for this visit.    Allergies  Allergen Reactions  . Cleocin [Clindamycin Hcl]     Gi upset  . Demerol     Gi upset  . Erythromycin     Gi upset  . Meperidine Hcl     GI upset  . Penicillins Rash    Past Medical History  Diagnosis Date  . Atrial fibrillation     a.03/2009 DCCV;  b. 11/2010 DCCV;  c.  maintained on amio 100mg  daily and xarelto.  . Sinus bradycardia   . Edema   . DDD (degenerative disc disease)   . HTN (hypertension)   . Depression, reactive, psychotic   . Mitral regurgitation     a. 03/2010 Echo: EF 60-65%, no rwma, gr 2 dd, mild MR, mild bi-atrial enlargement.  . Chronic pain   . Tardive dyskinesia   . COPD (chronic obstructive pulmonary disease)   . GERD (gastroesophageal reflux disease)   . Anemia      S/p egd/colon 12/11 ->no source for anemia found. Capsule enteroscopy vs heme consult being considered.  . Chest pain     a. 07/2011 MV: EF 65%, soft tissue attenuation, no ischemia. b. 12/2012: normal nuc.   Marland Kitchen Aortic stenosis     a. 12/2012 echo - mild-mod.  . Schizophrenia     Past Surgical History  Procedure Laterality Date  . Laminectomy      x 2, 2nd one w/ fusion  . Cesarean section    . Cardioversion  12/02/2010    Procedure: CARDIOVERSION;  Surgeon: Marca Ancona, MD;  [facility];  Service: Cardiovascular;  Laterality: N/A;  OUTPATIENT CARDIOVERSION  . Cataract surgery      march and June 2014  . Hernia repair    . Esophagogastroduodenoscopy (egd) with propofol N/A 01/02/2013    Procedure: ESOPHAGOGASTRODUODENOSCOPY (EGD) WITH PROPOFOL;  Surgeon: Shirley Friar, MD;  Location: [facility] ENDOSCOPY;  Service: Endoscopy;  Laterality: N/A;  . Colonoscopy with propofol N/A 01/02/2013    Procedure: COLONOSCOPY WITH PROPOFOL;  Surgeon: Shirley Friar, MD;  Location: [facility] ENDOSCOPY;  Service: Endoscopy;  Laterality: N/A;    History  Smoking status  . Former Smoker  . Quit date: 12/26/2009  Smokeless tobacco  . Never Used    History  Alcohol Use No    Comment: None    Family History  Problem Relation Age of Onset  . Arrhythmia Neg Hx   . Cancer Sister   . Alcoholism Father     Review of Systems: The review of systems is per the HPI.  All other systems were reviewed and are negative.  Physical Exam: BP 90/60  Pulse 58  Ht 5\' 4"   (1.626 m)  Wt 153 lb 1.9 oz (69.455 kg)  BMI 26.27 kg/m2  SpO2 95% Patient is very pleasant and in no acute distress. She has tardive  dyskinesia. Skin is warm and dry. Color is normal.  HEENT is unremarkable. Normocephalic/atraumatic. PERRL. Sclera are nonicteric. Neck is supple. No masses. No JVD. Lungs are clear. Cardiac exam shows a regular rate and rhythm. Harsh outflow murmur. Abdomen is soft. Extremities are without edema. Gait and ROM are intact. She is using a walker. No gross neurologic deficits noted.  LABORATORY DATA:  Pending    Lab Results  Component Value Date   WBC 5.5 02/27/2013   HGB 10.7* 02/27/2013   HCT 34.2* 02/27/2013   PLT 214 02/27/2013   GLUCOSE 106 02/27/2013   CHOL 172 01/20/2013   TRIG 34 01/20/2013   HDL 89 01/20/2013   LDLCALC 76 01/20/2013   ALT 9 02/27/2013   AST 14 02/27/2013   NA 135* 02/27/2013   K 4.6 02/27/2013   CL 104 01/20/2013   CREATININE 0.7 02/27/2013   BUN 10.6 02/27/2013   CO2 28 02/27/2013   TSH 0.669 01/19/2013   INR 1.05 01/19/2013    Echo Study Conclusions from December 2014  - Left ventricle: The cavity size was normal. There was moderate concentric hypertrophy. Systolic function was vigorous. The estimated ejection fraction was in the range of 65% to 70%. Wall motion was normal; there were no regional wall motion abnormalities. Features are consistent with a pseudonormal left ventricular filling pattern, with concomitant abnormal relaxation and increased filling pressure (grade 2 diastolic dysfunction). Doppler parameters are consistent with high ventricular filling pressure. - Aortic valve: There was moderate stenosis. Valve area: 1.48cm^2(VTI). Valve area: 1.43cm^2 (Vmax). - Left atrium: The atrium was mildly dilated. - Tricuspid valve: Moderate regurgitation. - Pulmonary arteries: PA peak pressure: 31mm Hg (S).    Lexiscan Myoview from December 2014  TECHNIQUE: The patient received IV Lexiscan .4mg  over 15 seconds. 33.0 mCi  of Technetium 46m Sestamibi injected at 30 seconds. Quantitative SPECT images were obtained in the vertical, horizontal and short axis planes after a 45 minute delay. Rest images were obtained with similar planes and delay using 10.2 mCi of Technetium 84m Sestamibi.  FINDINGS: ECG: SR, normal ECG, unchanged with Lexiscan infusion  Symptoms: Chest discomfort  RAW Data: Minimal motion  Quantitative Gated SPECT EF: Normal wall motion. LV EF 74%.  Perfusion Images: No perfusion defect.  IMPRESSION: 1. Normal LVEF 74%, normal wall motion.  2. Normal ECG not suggestive of ischemia.  3. Normal scan, no perfusion defect.  Tobias Alexander   Electronically Signed By: Tobias Alexander On: 01/20/2013 15:40   Assessment / Plan:  #1. Atrial fibrillation - she remains in sinus rhythm today.  She does need followup labs today.  #2. Hypertension - blood pressure is a little low today she is totally asymptomatic. We'll see if she can have some random blood pressure checks at the facility where she lives.  #3. Diastolic heart failure - compensated.  #4. Aortic stenosis - will need followup echocardiogram in December.  I will get her back to see Dr. Johney Frame in December. No changes in her current medicines. We will check followup labs today.  Patient is agreeable to this plan and will call if any problems develop in the interim.   Rosalio Macadamia, RN, ANP-C [facility] [address] [place], [place]  [address] [phone]

## 2013-06-30 NOTE — Patient Instructions (Addendum)
Stay on your same medicines  We need to check follow labs today  Please have the staff obtain some random BP checks - let us know if consistently staying below 100 systolic or if having dizzy/lightheaded spells  See Dr. Johney Frame in 6 months with an echocardiogram on the same day  Call the [facility] office at [phone] if you have any questions, problems or concerns.

## 2013-07-01 ENCOUNTER — Telehealth: Payer: Self-pay | Admitting: Nurse Practitioner

## 2013-07-01 NOTE — Telephone Encounter (Signed)
New message ° ° ° ° ° °Returning Danielle's call °

## 2013-08-07 NOTE — Telephone Encounter (Signed)
Close Encounter 

## 2013-08-13 ENCOUNTER — Other Ambulatory Visit: Payer: Self-pay | Admitting: Internal Medicine

## 2013-10-21 ENCOUNTER — Ambulatory Visit
Admission: RE | Admit: 2013-10-21 | Discharge: 2013-10-21 | Disposition: A | Discharge status: Home / Self Care | Payer: Medicare HMO | Source: Ambulatory Visit | Attending: Family Medicine | Admitting: Family Medicine

## 2013-10-21 ENCOUNTER — Other Ambulatory Visit: Payer: Self-pay | Admitting: Family Medicine

## 2013-10-21 DIAGNOSIS — E871 Hypo-osmolality and hyponatremia: Secondary | ICD-10-CM

## 2013-12-28 ENCOUNTER — Other Ambulatory Visit: Payer: Self-pay | Admitting: Internal Medicine

## 2014-01-17 ENCOUNTER — Other Ambulatory Visit: Payer: Self-pay | Admitting: Internal Medicine

## 2014-01-31 ENCOUNTER — Other Ambulatory Visit: Payer: Self-pay | Admitting: Internal Medicine

## 2014-03-11 ENCOUNTER — Encounter: Payer: Self-pay | Admitting: Nurse Practitioner

## 2014-03-11 ENCOUNTER — Ambulatory Visit (INDEPENDENT_AMBULATORY_CARE_PROVIDER_SITE_OTHER): Payer: Medicare HMO | Admitting: Nurse Practitioner

## 2014-03-11 ENCOUNTER — Ambulatory Visit
Admission: RE | Admit: 2014-03-11 | Discharge: 2014-03-11 | Disposition: A | Discharge status: Home / Self Care | Payer: Medicare Other | Source: Ambulatory Visit | Attending: Nurse Practitioner | Admitting: Nurse Practitioner

## 2014-03-11 VITALS — BP 100/50 | HR 47 | Ht 64.0 in | Wt 158.8 lb

## 2014-03-11 DIAGNOSIS — I350 Nonrheumatic aortic (valve) stenosis: Secondary | ICD-10-CM

## 2014-03-11 DIAGNOSIS — I5032 Chronic diastolic (congestive) heart failure: Secondary | ICD-10-CM | POA: Diagnosis not present

## 2014-03-11 DIAGNOSIS — I480 Paroxysmal atrial fibrillation: Secondary | ICD-10-CM

## 2014-03-11 DIAGNOSIS — R0600 Dyspnea, unspecified: Secondary | ICD-10-CM

## 2014-03-11 DIAGNOSIS — Z79899 Other long term (current) drug therapy: Secondary | ICD-10-CM

## 2014-03-11 DIAGNOSIS — I4891 Unspecified atrial fibrillation: Secondary | ICD-10-CM

## 2014-03-11 DIAGNOSIS — I10 Essential (primary) hypertension: Secondary | ICD-10-CM

## 2014-03-11 MED ORDER — RIVAROXABAN 20 MG PO TABS: 20.0000 mg | ORAL_TABLET | Freq: Every day | ORAL | Status: AC

## 2014-03-11 MED ORDER — PANTOPRAZOLE SODIUM 40 MG PO TBEC: 40.0000 mg | DELAYED_RELEASE_TABLET | Freq: Every day | ORAL | Status: AC

## 2014-03-11 MED ORDER — AMIODARONE HCL 200 MG PO TABS: 100.0000 mg | ORAL_TABLET | ORAL | Status: DC

## 2014-03-11 MED ORDER — FERROUS SULFATE 325 (65 FE) MG PO TABS: 325.0000 mg | ORAL_TABLET | Freq: Two times a day (BID) | ORAL | Status: DC

## 2014-03-11 MED ORDER — LISINOPRIL 10 MG PO TABS: 5.0000 mg | ORAL_TABLET | Freq: Every day | ORAL | Status: DC

## 2014-03-11 NOTE — Progress Notes (Signed)
CARDIOLOGY OFFICE NOTE  Date:  03/11/2014    Kathryn Smith Date of Birth: 1933-01-21 Medical Record #[id number]  PCP:  Thora Lance, MD  Cardiologist:  Allred    Chief Complaint  Patient presents with  . Atrial Fibrillation    8 month check - seen for Dr. Johney Frame.    History of Present Illness: Kathryn Smith is a 81 y.o. female who presents today for a follow up visit. She is seen for Dr. Johney Frame. She has multiple medical problems. These include atrial fibrillation, prior cardioversion in 2012, chronic Amiodarone therapy, chronic anti-coagulation with Xarelto, chronic diastolic heart failure, hypertension, and depression.  Her last Myoview was in December of 2014 which showed normal perfusion and normal ejection fraction. Her last echocardiogram in December of 2014 showed moderate LVH, normal ejection fraction, grade 2 diastolic dysfunction and moderate aortic stenosis.  She was last seen here by me back in June of 2015. Felt to be stable.   She comes in today. She is here with her daughter. Has been doing ok. Feels like she is more short of breath. Says this is worse with talking. Had one spell of chest pain about 6 weeks ago - took NTG x 2 - then burped and felt better. This occurred while riding in the car. No exertional symptoms but remains pretty sedentary. No dizziness. BP remains low. Chronic pain issues on chronic narcotics. Very fatigued. Not sleeping well. Husband died back in 09-19-2022. She is asking to see pulmonary. Rhythm has been ok. No falls. No bleeding or bruising noted. She sees PCP later this week.   Past Medical History  Diagnosis Date  . Atrial fibrillation     a.03/2009 DCCV;  b. 11/2010 DCCV;  c. maintained on amio  daily and xarelto.  . Sinus bradycardia   . Edema   . DDD (degenerative disc disease)   . HTN (hypertension)   . Depression, reactive, psychotic   . Mitral regurgitation     a. 03/2010 Echo: EF 60-65%, no rwma, gr 2 dd, mild MR, mild  bi-atrial enlargement.  . Chronic pain   . Tardive dyskinesia   . COPD (chronic obstructive pulmonary disease)   . GERD (gastroesophageal reflux disease)   . Anemia      S/p egd/colon 12/11 ->no source for anemia found. Capsule enteroscopy vs heme consult being considered.  . Chest pain     a. 07/2011 MV: EF 65%, soft tissue attenuation, no ischemia. b. 12/2012: normal nuc.   Marland Kitchen Aortic stenosis     a. 12/2012 echo - mild-mod.  . Schizophrenia     Past Surgical History  Procedure Laterality Date  . Laminectomy      x 2, 2nd one w/ fusion  . Cesarean section    . Cardioversion  12/02/2010    Procedure: CARDIOVERSION;  Surgeon: Marca Ancona, MD;  Location: [facility] OR;  Service: Cardiovascular;  Laterality: N/A;  OUTPATIENT CARDIOVERSION  . Cataract surgery      march and June 2014  . Hernia repair    . Esophagogastroduodenoscopy (egd) with propofol N/A 01/02/2013    Procedure: ESOPHAGOGASTRODUODENOSCOPY (EGD) WITH PROPOFOL;  Surgeon: Shirley Friar, MD;  Location: [facility];  Service: Endoscopy;  Laterality: N/A;  . Colonoscopy with propofol N/A 01/02/2013    Procedure: COLONOSCOPY WITH PROPOFOL;  Surgeon: Shirley Friar, MD;  Location: [facility];  Service: Endoscopy;  Laterality: N/A;     Medications: Current Outpatient Prescriptions  Medication Sig Dispense Refill  . Alpha  Lipoic Acid 200 MG CAPS Take 200 mg by mouth 3 (three) times daily.    Marland Kitchen ALPRAZolam (XANAX) 1 MG tablet Take 1 mg by mouth at bedtime.    Marland Kitchen amiodarone (PACERONE) 200 MG tablet take 1/2 tablet by mouth once daily (Patient taking differently: take 100 mg daily) 45 tablet 4  . docusate sodium (COLACE) 100 MG capsule Take 100 mg by mouth every morning.    . ferrous sulfate 325 (65 FE) MG tablet Take 1 tablet (325 mg total) by mouth 2 (two) times daily with a meal. Take before am meal with vitamin C and qhs with Vitamin C. 180 tablet 3  . fexofenadine (ALLEGRA) 180 MG tablet Take 180 mg by mouth daily.      . Flavocoxid (LIMBREL PO) Take 400 mg by mouth 2 (two) times daily.    . fluPHENAZine (PROLIXIN) 2.5 MG tablet Take 2.5 mg by mouth at bedtime.     . gabapentin (NEURONTIN) 300 MG capsule Take 300 mg by mouth 3 (three) times daily.     Marland Kitchen HYDROmorphone HCl (EXALGO) 8 MG T24A SR tablet Take 8 mg by mouth daily.    . Iloperidone 2 MG TABS Take 2 mg by mouth at bedtime.    . lidocaine (LIDODERM) 5 % Place 1 patch onto the skin as needed. Remove & Discard patch within 12 hours or as directed by MD    . lisinopril (PRINIVIL,ZESTRIL) 10 MG tablet Take 0.5 tablets (5 mg total) by mouth daily. 45 tablet 3  . Methylfol-Methylcob-Acetylcyst (CEREFOLIN NAC) 6-2-600 MG TABS Take 1 tablet by mouth 2 (two) times daily.     . nitroGLYCERIN (NITROSTAT) 0.4 MG SL tablet Place 0.4 mg under the tongue every 5 (five) minutes as needed for chest pain.    Marland Kitchen OVER THE COUNTER MEDICATION Take 2 capsules by mouth daily. Cardilast - avacado soy supplement.    Marland Kitchen oxyCODONE-acetaminophen (PERCOCET) 5-325 MG per tablet Take 1 tablet by mouth 3 (three) times daily. Takes extra dose at 3 am if needed    . pantoprazole (PROTONIX) 40 MG tablet Take 1 tablet (40 mg total) by mouth daily. 90 tablet 3  . polyethylene glycol (MIRALAX / GLYCOLAX) packet Take 17 g by mouth daily as needed for mild constipation.     . Psyllium (METAMUCIL PO) Take by mouth as needed.     . rivaroxaban (XARELTO) 20 MG TABS tablet Take 1 tablet (20 mg total) by mouth daily. 90 tablet 3  . senna (SENOKOT) 8.6 MG tablet Take 2 tablets by mouth at bedtime.     . traZODone (DESYREL) 50 MG tablet Take 200 mg by mouth at bedtime.      No current facility-administered medications for this visit.    Allergies: Allergies  Allergen Reactions  . Cleocin [Clindamycin Hcl]     Gi upset  . Demerol     Gi upset  . Erythromycin     Gi upset  . Meperidine Hcl     GI upset  . Penicillins Rash    Social History: The patient  reports that she quit smoking  about 4 years ago. She has never used smokeless tobacco. She reports that she does not drink alcohol or use illicit drugs.   Family History: The patient's family history includes Alcoholism in her father; Cancer in her sister. There is no history of Arrhythmia.   Review of Systems: Please see the history of present illness.   Otherwise, the review of systems is positive  for constipation, progressive dyspnea, chronic pain, balance issues, wheezing and constipation.   All other systems are reviewed and negative.   Physical Exam: VS:  BP 100/50 mmHg  Pulse 47  Ht  (1.626 m)  Wt 158 lb 12.8 oz (72.031 kg)  BMI 27.24 kg/m2  SpO2 95% .  BMI Body mass index is 27.24 kg/(m^2).  Wt Readings from Last 3 Encounters:  03/11/14 158 lb 12.8 oz (72.031 kg)  06/30/13 153 lb 1.9 oz (69.455 kg)  03/06/13 154 lb (69.854 kg)    General: Pleasant. Chronically ill. In no acute distress.  HEENT: Normal but with dentures. Neck: Supple, no JVD, carotid bruits, or masses noted.  Cardiac: Regular rate and rhythm. Rate is slow. Harsh outflow murmur. No edema.  Respiratory:  Lungs are clear to auscultation bilaterally with normal work of breathing.  GI: Soft and nontender.  MS: No deformity or atrophy. Gait and ROM intact. Skin: Warm and dry. Color is normal.  Neuro:  Strength and sensation are intact and no gross focal deficits noted.  Psych: Alert, appropriate and with normal affect.   LABORATORY DATA:  EKG:  EKG is ordered today. This shows profound bradycardia with a rate of 48.     Lab Results  Component Value Date   WBC 5.8 06/30/2013   HGB 11.7* 06/30/2013   HCT 35.8* 06/30/2013   PLT 208.0 06/30/2013   GLUCOSE 86 06/30/2013   CHOL 183 06/30/2013   TRIG 33.0 06/30/2013   HDL 68.20 06/30/2013   LDLCALC 108* 06/30/2013   ALT 9 06/30/2013   AST 14 06/30/2013   NA 136 06/30/2013   K 4.6 06/30/2013   CL 101 06/30/2013   CREATININE 0.6 06/30/2013   BUN 11 06/30/2013   CO2 28  06/30/2013   TSH 0.67 06/30/2013   INR 1.05 01/19/2013    BNP (last 3 results) No results for input(s): BNP in the last 8760 hours.  ProBNP (last 3 results) No results for input(s): PROBNP in the last 8760 hours.   Other Studies Reviewed Today:  Echo Study Conclusions from December 2014  - Left ventricle: The cavity size was normal. There was moderate concentric hypertrophy. Systolic function was vigorous. The estimated ejection fraction was in the range of 65% to 70%. Wall motion was normal; there were no regional wall motion abnormalities. Features are consistent with a pseudonormal left ventricular filling pattern, with concomitant abnormal relaxation and increased filling pressure (grade 2 diastolic dysfunction). Doppler parameters are consistent with high ventricular filling pressure. - Aortic valve: There was moderate stenosis. Valve area: 1.48cm^2(VTI). Valve area: 1.43cm^2 (Vmax). - Left atrium: The atrium was mildly dilated. - Tricuspid valve: Moderate regurgitation. - Pulmonary arteries: PA peak pressure: 31mm Hg (S).    Lexiscan Myoview from December 2014 IMPRESSION: 1. Normal LVEF 74%, normal wall motion.  2. Normal ECG not suggestive of ischemia.  3. Normal scan, no perfusion defect.  Tobias Alexander   Electronically Signed By: Tobias Alexander On: 01/20/2013 15:40   Assessment / Plan:  #1. Atrial fibrillation - She does need followup labs today. She remains on amiodarone and Xarelto.  #2. Hypertension - BP now low and too soft - will cut back on her ACE.  #3. Diastolic heart failure - more dyspnea noted - most likely multifactorial. Will check BNP and update her echo.   #4. Aortic stenosis - she has not had her follow up echo completed - will get this updated.   #5. Dyspnea - most likely multifactorial -  will send for CXR and get PFTs. She is on amiodarone.   #6. Marked bradycardia - not dizzy or lightheaded but at higher fall risk - on  amiodarone 100 mg a day - will cut back to every other day. May not be able to keep her on this dose. May need to consider Holter.   #7. Schizophrenia - on lots of drugs that can potentiate her QT - especially with her amiodarone - fortunately, QT is ok today at 458ms.  Needs to be seen in a month with repeat EKG on day that Dr. Johney FrameAllred is here.   Current medicines are reviewed with the patient today.  The patient does not have concerns regarding medicines other than what has been noted above.  The following changes have been made:  See above.  Labs/ tests ordered today include:    Orders Placed This Encounter  Procedures  . DG Chest 2 View  . Basic metabolic panel  . CBC  . Hepatic function panel  . Lipid panel  . TSH  . Brain natriuretic peptide  . EKG 12-Lead  . Pulmonary function test   Echo to be scheduled (order released)  Disposition:   See me in one month with EKG on day that Dr. Johney FrameAllred is here.   Patient is agreeable to this plan and will call if any problems develop in the interim.   Signed: Rosalio MacadamiaLori C. Eliyohu Class, RN, ANP-C 03/11/2014 3:55 PM  [facility] [address] [place], [place]  [address] Phone: [phone] Fax: [phone]

## 2014-03-11 NOTE — Patient Instructions (Addendum)
We will be checking the following labs today BNP, BMET, CBC, HPF, Lipids and TSH  We need to get an EKG today  We need to get your ultrasound of your heart updated  [facility] with diffusion  [facility] on the first floor for a chest Xray - you may walk in.   Stay on your current medicines but I am cutting the Lisinopril back to 1/2 tablet daily  I am cutting the amiodarone back to 100 mg EVERY OTHER DAY  See me in one month with EKG on day that Dr. Johney FrameAllred is here.  Call the [facility] office at [phone] if you have any questions, problems or concerns.

## 2014-03-12 ENCOUNTER — Telehealth: Payer: Self-pay | Admitting: Internal Medicine

## 2014-03-12 LAB — BASIC METABOLIC PANEL
BUN: 14 mg/dL (ref 6–23)
CO2: 30 mEq/L (ref 19–32)
Calcium: 9.3 mg/dL (ref 8.4–10.5)
Chloride: 99 mEq/L (ref 96–112)
Creatinine, Ser: 0.77 mg/dL (ref 0.40–1.20)
GFR: 76.7 mL/min (ref >60.00)
Glucose, Bld: 88 mg/dL (ref 70–99)
Potassium: 4.8 mEq/L (ref 3.5–5.1)
Sodium: 133 mEq/L — ABNORMAL LOW (ref 135–145)

## 2014-03-12 LAB — HEPATIC FUNCTION PANEL
ALT: 10 U/L (ref 0–35)
AST: 15 U/L (ref 0–37)
Albumin: 3.9 g/dL (ref 3.5–5.2)
Alkaline Phosphatase: 61 U/L (ref 39–117)
Bilirubin, Direct: 0 mg/dL (ref 0.0–0.3)
Total Bilirubin: 0.2 mg/dL (ref 0.2–1.2)
Total Protein: 6.5 g/dL (ref 6.0–8.3)

## 2014-03-12 LAB — CBC
HCT: 35.8 % — ABNORMAL LOW (ref 36.0–46.0)
Hemoglobin: 11.9 g/dL — ABNORMAL LOW (ref 12.0–15.0)
MCHC: 33.2 g/dL (ref 30.0–36.0)
MCV: 93.9 fl (ref 78.0–100.0)
Platelets: 187 10*3/uL (ref 150.0–400.0)
RBC: 3.81 Mil/uL — ABNORMAL LOW (ref 3.87–5.11)
RDW: 13.9 % (ref 11.5–15.5)
WBC: 4.7 10*3/uL (ref 4.0–10.5)

## 2014-03-12 LAB — LIPID PANEL
Cholesterol: 184 mg/dL (ref 0–200)
HDL: 65.2 mg/dL (ref >39.00)
LDL Cholesterol: 108 mg/dL — ABNORMAL HIGH (ref 0–99)
NonHDL: 118.8
Total CHOL/HDL Ratio: 3
Triglycerides: 55 mg/dL (ref 0.0–149.0)
VLDL: 11 mg/dL (ref 0.0–40.0)

## 2014-03-12 LAB — BRAIN NATRIURETIC PEPTIDE: Pro B Natriuretic peptide (BNP): 62 pg/mL (ref 0.0–100.0)

## 2014-03-12 LAB — TSH: TSH: 0.82 u[IU]/mL (ref 0.35–4.50)

## 2014-03-12 NOTE — Telephone Encounter (Signed)
New Message    Please call patients daughter regarding her moms appt. She needs the appt r/s but states both providers Tyrone Sage(Gerhardt and Allred has to be in office @ the same time. Please give a call back.

## 2014-03-16 ENCOUNTER — Telehealth: Payer: Self-pay | Admitting: *Deleted

## 2014-03-16 NOTE — Telephone Encounter (Signed)
-----   Message from Rosalio MacadamiaLori C Gerhardt, NP sent at 03/13/2014  8:23 AM EST ----- Ok to report. May have to call her daughter with the results. Fluid level test is normal - this is good and means that her shortness of breath is more likely coming from her lungs. Her blood count is stable. Thyroid ok. Lipids ok.  Stay on current regimen. Will see how the echo and PFTs turn out and will then decide about referral to pulmonary.

## 2014-03-17 ENCOUNTER — Other Ambulatory Visit (HOSPITAL_COMMUNITY): Payer: Medicare Other

## 2014-03-19 ENCOUNTER — Other Ambulatory Visit: Payer: Self-pay | Admitting: Family Medicine

## 2014-03-19 DIAGNOSIS — Z1231 Encounter for screening mammogram for malignant neoplasm of breast: Secondary | ICD-10-CM

## 2014-03-24 ENCOUNTER — Ambulatory Visit (HOSPITAL_COMMUNITY): Payer: Medicare HMO | Attending: Family Medicine | Admitting: Cardiology

## 2014-03-24 ENCOUNTER — Other Ambulatory Visit (HOSPITAL_COMMUNITY): Payer: Self-pay | Admitting: Family Medicine

## 2014-03-24 DIAGNOSIS — I350 Nonrheumatic aortic (valve) stenosis: Secondary | ICD-10-CM | POA: Diagnosis present

## 2014-03-24 NOTE — Progress Notes (Signed)
Echo performed. 

## 2014-03-31 ENCOUNTER — Ambulatory Visit
Admission: RE | Admit: 2014-03-31 | Discharge: 2014-03-31 | Disposition: A | Discharge status: Home / Self Care | Payer: Medicare HMO | Source: Ambulatory Visit | Attending: Family Medicine | Admitting: Family Medicine

## 2014-03-31 DIAGNOSIS — Z1231 Encounter for screening mammogram for malignant neoplasm of breast: Secondary | ICD-10-CM

## 2014-04-02 ENCOUNTER — Ambulatory Visit (INDEPENDENT_AMBULATORY_CARE_PROVIDER_SITE_OTHER): Payer: Medicare HMO | Admitting: Internal Medicine

## 2014-04-02 DIAGNOSIS — Z79899 Other long term (current) drug therapy: Secondary | ICD-10-CM

## 2014-04-02 DIAGNOSIS — I480 Paroxysmal atrial fibrillation: Secondary | ICD-10-CM

## 2014-04-02 DIAGNOSIS — I10 Essential (primary) hypertension: Secondary | ICD-10-CM

## 2014-04-02 DIAGNOSIS — I5032 Chronic diastolic (congestive) heart failure: Secondary | ICD-10-CM

## 2014-04-02 DIAGNOSIS — I4891 Unspecified atrial fibrillation: Secondary | ICD-10-CM

## 2014-04-02 DIAGNOSIS — I350 Nonrheumatic aortic (valve) stenosis: Secondary | ICD-10-CM

## 2014-04-02 LAB — PULMONARY FUNCTION TEST
DL/VA % pred: 87 %
DL/VA: 4.1 ml/min/mmHg/L
DLCO unc % pred: 72 %
DLCO unc: 16.62 ml/min/mmHg
FEF 25-75 Post: 0.79 L/sec
FEF 25-75 Pre: 0.89 L/sec
FEF2575-%Change-Post: -11 %
FEF2575-%Pred-Post: 57 %
FEF2575-%Pred-Pre: 65 %
FEV1-%Change-Post: -2 %
FEV1-%Pred-Post: 74 %
FEV1-%Pred-Pre: 76 %
FEV1-Post: 1.37 L
FEV1-Pre: 1.41 L
FEV1FVC-%Change-Post: 3 %
FEV1FVC-%Pred-Pre: 94 %
FEV6-%Change-Post: -6 %
FEV6-%Pred-Post: 79 %
FEV6-%Pred-Pre: 85 %
FEV6-Post: 1.88 L
FEV6-Pre: 2.01 L
FEV6FVC-%Change-Post: 0 %
FEV6FVC-%Pred-Post: 105 %
FEV6FVC-%Pred-Pre: 105 %
FVC-%Change-Post: -5 %
FVC-%Pred-Post: 76 %
FVC-%Pred-Pre: 80 %
FVC-Post: 1.9 L
FVC-Pre: 2.02 L
Post FEV1/FVC ratio: 72 %
Post FEV6/FVC ratio: 99 %
Pre FEV1/FVC ratio: 70 %
Pre FEV6/FVC Ratio: 100 %

## 2014-04-02 NOTE — Progress Notes (Signed)
PFT done today. 

## 2014-04-20 ENCOUNTER — Ambulatory Visit: Payer: Medicare Other | Admitting: Internal Medicine

## 2014-04-29 ENCOUNTER — Ambulatory Visit (INDEPENDENT_AMBULATORY_CARE_PROVIDER_SITE_OTHER): Payer: Medicare HMO | Admitting: Internal Medicine

## 2014-04-29 ENCOUNTER — Encounter: Payer: Self-pay | Admitting: Internal Medicine

## 2014-04-29 VITALS — BP 112/62 | HR 68 | Ht 64.0 in | Wt 160.0 lb

## 2014-04-29 DIAGNOSIS — I481 Persistent atrial fibrillation: Secondary | ICD-10-CM | POA: Diagnosis not present

## 2014-04-29 DIAGNOSIS — I10 Essential (primary) hypertension: Secondary | ICD-10-CM

## 2014-04-29 DIAGNOSIS — I4819 Other persistent atrial fibrillation: Secondary | ICD-10-CM

## 2014-04-29 DIAGNOSIS — I5032 Chronic diastolic (congestive) heart failure: Secondary | ICD-10-CM

## 2014-04-29 DIAGNOSIS — I350 Nonrheumatic aortic (valve) stenosis: Secondary | ICD-10-CM | POA: Diagnosis not present

## 2014-04-29 MED ORDER — LISINOPRIL 5 MG PO TABS: 5.0000 mg | ORAL_TABLET | Freq: Every day | ORAL | Status: DC

## 2014-04-29 NOTE — Progress Notes (Signed)
The patient presents today for routine electrophysiology followup.  She recently had symptoms with sinus bradycardia of fatigue.  Her amiodarone was reduced.  She is now back in afib however her V rates are stable and she is asymptomatic.  She actually feels better than last visit.  Today, she denies symptoms of palpitations, exertional CP, SOB above baseline, orthopnea, PND,  presyncope, syncope, or neurologic sequela.  The patient feels that she is tolerating medications without difficulties and is otherwise without complaint today.   Past Medical History  Diagnosis Date  . Atrial fibrillation     a.03/2009 DCCV;  b. 11/2010 DCCV;  c. maintained on amio  daily and xarelto.  . Sinus bradycardia   . Edema   . DDD (degenerative disc disease)   . HTN (hypertension)   . Depression, reactive, psychotic   . Mitral regurgitation     a. 03/2010 Echo: EF 60-65%, no rwma, gr 2 dd, mild MR, mild bi-atrial enlargement.  . Chronic pain   . Tardive dyskinesia   . COPD (chronic obstructive pulmonary disease)   . GERD (gastroesophageal reflux disease)   . Anemia      S/p egd/colon 12/11 ->no source for anemia found. Capsule enteroscopy vs heme consult being considered.  . Chest pain     a. 07/2011 MV: EF 65%, soft tissue attenuation, no ischemia. b. 12/2012: normal nuc.   Marland Kitchen Aortic stenosis     a. 12/2012 echo - mild-mod.  . Schizophrenia    Past Surgical History  Procedure Laterality Date  . Laminectomy      x 2, 2nd one w/ fusion  . Cesarean section    . Cardioversion  12/02/2010    Procedure: CARDIOVERSION;  Surgeon: Marca Ancona, MD;  Location: [facility] OR;  Service: Cardiovascular;  Laterality: N/A;  OUTPATIENT CARDIOVERSION  . Cataract surgery      march and June 2014  . Hernia repair    . Esophagogastroduodenoscopy (egd) with propofol N/A 01/02/2013    Procedure: ESOPHAGOGASTRODUODENOSCOPY (EGD) WITH PROPOFOL;  Surgeon: Shirley Friar, MD;  Location: [facility];  Service: Endoscopy;   Laterality: N/A;  . Colonoscopy with propofol N/A 01/02/2013    Procedure: COLONOSCOPY WITH PROPOFOL;  Surgeon: Shirley Friar, MD;  Location: [facility];  Service: Endoscopy;  Laterality: N/A;     Current outpatient prescriptions:  .  Alpha-Lipoic Acid 100 MG CAPS, Take 1 capsule by mouth 3 (three) times daily., Disp: , Rfl:  .  ALPRAZolam (XANAX) 1 MG tablet, Take 1 mg by mouth at bedtime., Disp: , Rfl:  .  amiodarone (PACERONE) 200 MG tablet, Take 0.5 tablets (100 mg total) by mouth every other day., Disp: 45 tablet, Rfl: 3 .  Cholecalciferol (VITAMIN D) 2000 UNITS tablet, Take 2,000 Units by mouth daily., Disp: , Rfl:  .  docusate sodium (COLACE) 100 MG capsule, Take 100 mg by mouth every morning., Disp: , Rfl:  .  ferrous sulfate 325 (65 FE) MG tablet, Take 1 tablet (325 mg total) by mouth 2 (two) times daily with a meal. Take before am meal with vitamin C and qhs with Vitamin C., Disp: 180 tablet, Rfl: 3 .  fexofenadine (ALLEGRA) 180 MG tablet, Take 180 mg by mouth daily., Disp: , Rfl:  .  Flavocoxid (LIMBREL PO), Take 400 mg by mouth 2 (two) times daily., Disp: , Rfl:  .  fluPHENAZine (PROLIXIN) 2.5 MG tablet, Take 2.5 mg by mouth at bedtime. , Disp: , Rfl:  .  gabapentin (NEURONTIN) 300 MG  capsule, Take 300 mg by mouth 3 (three) times daily. , Disp: , Rfl:  .  HYDROmorphone HCl (EXALGO) 8 MG T24A SR tablet, Take 8 mg by mouth daily., Disp: , Rfl:  .  iloperidone (FANAPT) 4 MG TABS tablet, Take 4 mg by mouth at bedtime., Disp: , Rfl:  .  lidocaine (LIDODERM) 5 %, Place 1 patch onto the skin as needed. Remove & Discard patch within 12 hours or as directed by MD, Disp: , Rfl:  .  lisinopril (PRINIVIL,ZESTRIL) 10 MG tablet, Take 0.5 tablets (5 mg total) by mouth daily., Disp: 45 tablet, Rfl: 3 .  Methylfol-Methylcob-Acetylcyst (CEREFOLIN NAC) 6-2-600 MG TABS, Take 1 tablet by mouth 2 (two) times daily. , Disp: , Rfl:  .  nitroGLYCERIN (NITROSTAT) 0.4 MG SL tablet, Place 0.4 mg under  the tongue every 5 (five) minutes as needed for chest pain., Disp: , Rfl:  .  OVER THE COUNTER MEDICATION, Take 2 capsules by mouth daily. Cardilast - avocado soy supplement., Disp: , Rfl:  .  oxyCODONE-acetaminophen (PERCOCET) 5-325 MG per tablet, Take 1 tablet by mouth 3 (three) times daily. Takes extra dose at 3 am if needed for pain, Disp: , Rfl:  .  pantoprazole (PROTONIX) 40 MG tablet, Take 1 tablet (40 mg total) by mouth daily., Disp: 90 tablet, Rfl: 3 .  polyethylene glycol (MIRALAX / GLYCOLAX) packet, Take 17 g by mouth daily as needed for mild constipation. , Disp: , Rfl:  .  Psyllium (METAMUCIL PO), Take 1 capsule by mouth daily as needed (fiber). , Disp: , Rfl:  .  rivaroxaban (XARELTO) 20 MG TABS tablet, Take 1 tablet (20 mg total) by mouth daily., Disp: 90 tablet, Rfl: 3 .  senna (SENOKOT) 8.6 MG tablet, Take 2 tablets by mouth at bedtime. , Disp: , Rfl:  .  traZODone (DESYREL) 150 MG tablet, Take 300 mg by mouth at bedtime., Disp: , Rfl:  .  VOLTAREN 1 % GEL, Apply 2 g topically 4 (four) times daily as needed (pain). , Disp: , Rfl:   Allergies  Allergen Reactions  . Cleocin [Clindamycin Hcl]     Gi upset  . Demerol     Gi upset  . Erythromycin     Gi upset  . Meperidine Hcl     GI upset  . Penicillins Rash    History   Social History  . Marital Status: Married    Spouse Name: [name]  . Number of Children: 2  . Years of Education: 16   Occupational History  . Retired Engineer, civil (consulting)    Social History Main Topics  . Smoking status: Former Smoker    Quit date: 12/26/2009  . Smokeless tobacco: Never Used  . Alcohol Use: No     Comment: None  . Drug Use: No  . Sexual Activity: Not on file   Other Topics Concern  . Not on file   Social History Narrative   Married.  Resident of [employer], [place] side.  Nurses do administer her medication.  Ambulates with a walker.  Meals are provided.    Family History  Problem Relation Age of Onset  .  Arrhythmia Neg Hx   . Cancer Sister   . Alcoholism Father     Physical Exam: Filed Vitals:   04/29/14 1226  BP: 112/62  Pulse: 68  Height:  (1.626 m)  Weight: 160 lb (72.576 kg)    GEN- The patient is well appearing, alert and oriented x 3 today.  Head- normocephalic, atraumatic Eyes-  Sclera clear, conjunctiva pink Ears- hearing intact Oropharynx- clear Neck- supple, no JVP Lymph- no cervical lymphadenopathy Lungs- Clear to ausculation bilaterally, normal work of breathing Heart- irregular rate and rhythm, 2/6 SEM LUSB mid peaking GI- soft, NT, ND, + BS Extremities- no clubbing, cyanosis, or edema MS- walks slowly with a walker Skin- no rash or lesion Psych- euthymic mood, full affect Neuro- moderate resting tremor is noted  ekg today reveals afib, V rates 68 bpm Echo and PFTs reviewed with the patient today  FIBRILLATION, ATRIAL  Maintaining back in afib but rate controlled and with minimal symptoms On xarelto Will return in 6 weeks to see Lawson FiscalLori, if in sinus would keep on amiodarone.  If asymptomatic with afib, would stop amiodarone and rate control long term.  CHRONIC DIASTOLIC HEART FAILURE  euvolemic  No changes  Chest pain we will treat conservatively at this time  AS Stable without indication for surgery No change required today   Return in 6 weeks to see Lawson FiscalLori I will see in 3 months

## 2014-04-29 NOTE — Patient Instructions (Signed)
Your physician recommends that you schedule a follow-up appointment in: 6 weeks with Norma FredricksonLori Gerhardt, NP  Your physician wants you to follow-up in: 3 months with Dr. Jacquiline DoeAllred You will receive a reminder letter in the mail two months in advance. If you don't receive a letter, please call our office to schedule the follow-up appointment.  Your physician recommends that you continue on your current medications as directed. Please refer to the Current Medication list given to you today.

## 2014-06-16 ENCOUNTER — Ambulatory Visit (INDEPENDENT_AMBULATORY_CARE_PROVIDER_SITE_OTHER): Payer: Medicare HMO | Admitting: Nurse Practitioner

## 2014-06-16 ENCOUNTER — Encounter: Payer: Self-pay | Admitting: Nurse Practitioner

## 2014-06-16 VITALS — BP 104/62 | HR 72 | Ht 63.0 in | Wt 159.0 lb

## 2014-06-16 DIAGNOSIS — I5032 Chronic diastolic (congestive) heart failure: Secondary | ICD-10-CM

## 2014-06-16 DIAGNOSIS — I480 Paroxysmal atrial fibrillation: Secondary | ICD-10-CM | POA: Diagnosis not present

## 2014-06-16 DIAGNOSIS — Z79899 Other long term (current) drug therapy: Secondary | ICD-10-CM | POA: Diagnosis not present

## 2014-06-16 NOTE — Patient Instructions (Addendum)
We will be checking the following labs today - NONE   Medication Instructions:    Continue with your current medicines but  I am STOPPING amiodarone    Testing/Procedures To Be Arranged:  N/A  Follow-Up:   See Dr. Johney FrameAllred in 3 months    Other Special Instructions:   N/A  Call the [facility] office at [phone] if you have any questions, problems or concerns.

## 2014-06-16 NOTE — Progress Notes (Signed)
CARDIOLOGY OFFICE NOTE  Date:  06/16/2014    Kathryn Smith Date of Birth: November 16, 1932 Medical Record #[id number]  PCP:  Kathryn Lance, MD  Cardiologist:  Allred    Chief Complaint  Patient presents with  . Atrial Fibrillation    Follow up visit - seen for Dr. Johney Smith    History of Present Illness: Kathryn Smith is a 81 y.o. female who presents today for a follow up visit. She is seen for Dr. Johney Smith. She has multiple medical problems. These include atrial fibrillation, prior cardioversion in 2012, chronic Amiodarone therapy, chronic anti-coagulation with Xarelto, chronic diastolic heart failure, hypertension, and depression. Her last Myoview was in December of 2014 which showed normal perfusion and normal ejection fraction. Her last echocardiogram in December of 2014 showed moderate LVH, normal ejection fraction, grade 2 diastolic dysfunction and moderate aortic stenosis.  I saw her back in March - she was more short of breath and very fatigued. Amiodarone was cut back. Saw Dr. Johney Smith last month and she was noted to be AF with stable V rates. She was feeling better at her last visit here.   She comes in today. She is here with her daughter. Back to discuss long term amiodarone. She feels pretty good. Looks better and stronger than at my last visit with her. Breathing is ok. No chest pain. No palpitations. Has had 2 falls - one she tripped and hurt her knee - had to see ortho for a shot of cortisone. Second one she lost her balance. Did not hit her head. Did not pass out.   Past Medical History  Diagnosis Date  . Atrial fibrillation     a.03/2009 DCCV;  b. 11/2010 DCCV;  c. maintained on amio 100mg  daily and xarelto.  . Sinus bradycardia   . Edema   . DDD (degenerative disc disease)   . HTN (hypertension)   . Depression, reactive, psychotic   . Mitral regurgitation     a. 03/2010 Echo: EF 60-65%, no rwma, gr 2 dd, mild MR, mild bi-atrial enlargement.  . Chronic pain   . Tardive  dyskinesia   . COPD (chronic obstructive pulmonary disease)   . GERD (gastroesophageal reflux disease)   . Anemia      S/p egd/colon 12/11 ->no source for anemia found. Capsule enteroscopy vs heme consult being considered.  . Chest pain     a. 07/2011 MV: EF 65%, soft tissue attenuation, no ischemia. b. 12/2012: normal nuc.   Marland Kitchen Aortic stenosis     a. 12/2012 echo - mild-mod.  . Schizophrenia     Past Surgical History  Procedure Laterality Date  . Laminectomy      x 2, 2nd one w/ fusion  . Cesarean section    . Cardioversion  12/02/2010    Procedure: CARDIOVERSION;  Surgeon: Marca Ancona, MD;  Location: [facility] OR;  Service: Cardiovascular;  Laterality: N/A;  OUTPATIENT CARDIOVERSION  . Cataract surgery      march and June 2014  . Hernia repair    . Esophagogastroduodenoscopy (egd) with propofol N/A 01/02/2013    Procedure: ESOPHAGOGASTRODUODENOSCOPY (EGD) WITH PROPOFOL;  Surgeon: Shirley Friar, MD;  Location: [facility];  Service: Endoscopy;  Laterality: N/A;  . Colonoscopy with propofol N/A 01/02/2013    Procedure: COLONOSCOPY WITH PROPOFOL;  Surgeon: Shirley Friar, MD;  Location: [facility];  Service: Endoscopy;  Laterality: N/A;     Medications: Current Outpatient Prescriptions  Medication Sig Dispense Refill  . Alpha-Lipoic Acid 100 MG  CAPS Take 1 capsule by mouth 3 (three) times daily.    Marland Kitchen ALPRAZolam (XANAX) 1 MG tablet Take 1 mg by mouth at bedtime.    Marland Kitchen amiodarone (PACERONE) 200 MG tablet Take 0.5 tablets (100 mg total) by mouth every other day. 45 tablet 3  . Cholecalciferol (VITAMIN D) 2000 UNITS tablet Take 2,000 Units by mouth daily.    Marland Kitchen docusate sodium (COLACE) 100 MG capsule Take 100 mg by mouth every morning.    . ferrous sulfate 325 (65 FE) MG tablet Take 1 tablet (325 mg total) by mouth 2 (two) times daily with a meal. Take before am meal with vitamin C and qhs with Vitamin C. 180 tablet 3  . fexofenadine (ALLEGRA) 180 MG tablet Take 180 mg by mouth  daily.    . Flavocoxid (LIMBREL PO) Take 400 mg by mouth 2 (two) times daily.    . fluPHENAZine (PROLIXIN) 2.5 MG tablet Take 2.5 mg by mouth at bedtime.     . gabapentin (NEURONTIN) 300 MG capsule Take 300 mg by mouth 3 (three) times daily.     Marland Kitchen HYDROmorphone HCl (EXALGO) 8 MG T24A SR tablet Take 8 mg by mouth daily.    Marland Kitchen iloperidone (FANAPT) 4 MG TABS tablet Take 4 mg by mouth at bedtime.    . lidocaine (LIDODERM) 5 % Place 1 patch onto the skin as needed. Remove & Discard patch within 12 hours or as directed by MD    . lisinopril (PRINIVIL,ZESTRIL) 5 MG tablet Take 1 tablet (5 mg total) by mouth daily. 30 tablet 6  . Methylfol-Methylcob-Acetylcyst (CEREFOLIN NAC) 6-2-600 MG TABS Take 1 tablet by mouth 2 (two) times daily.     . nitroGLYCERIN (NITROSTAT) 0.4 MG SL tablet Place 0.4 mg under the tongue every 5 (five) minutes as needed for chest pain.    Marland Kitchen OVER THE COUNTER MEDICATION Take 2 capsules by mouth daily. Cardilast - avocado soy supplement.    Marland Kitchen oxyCODONE-acetaminophen (PERCOCET) 5-325 MG per tablet Take 1 tablet by mouth 3 (three) times daily. Takes extra dose at 3 am if needed for pain    . pantoprazole (PROTONIX) 40 MG tablet Take 1 tablet (40 mg total) by mouth daily. 90 tablet 3  . polyethylene glycol (MIRALAX / GLYCOLAX) packet Take 17 g by mouth daily as needed for mild constipation.     . Psyllium (METAMUCIL PO) Take 1 capsule by mouth daily as needed (fiber).     . rivaroxaban (XARELTO) 20 MG TABS tablet Take 1 tablet (20 mg total) by mouth daily. 90 tablet 3  . senna (SENOKOT) 8.6 MG tablet Take 2 tablets by mouth at bedtime.     . traZODone (DESYREL) 150 MG tablet Take 300 mg by mouth at bedtime.    . VOLTAREN 1 % GEL Apply 2 g topically 4 (four) times daily as needed (pain).      No current facility-administered medications for this visit.    Allergies: Allergies  Allergen Reactions  . Cleocin [Clindamycin Hcl]     Gi upset  . Demerol     Gi upset  . Erythromycin       Gi upset  . Meperidine Hcl     GI upset  . Penicillins Rash    Social History: The patient  reports that she quit smoking about 4 years ago. She has never used smokeless tobacco. She reports that she does not drink alcohol or use illicit drugs.   Family History: The patient's family history  includes Alcoholism in her father; Cancer in her sister. There is no history of Arrhythmia.   Review of Systems: Please see the history of present illness.   Otherwise, the review of systems is positive for none.   All other systems are reviewed and negative.   Physical Exam: VS:  BP 104/62 mmHg  Pulse 72  Ht 5\' 3"  (1.6 m)  Wt 159 lb (72.122 kg)  BMI 28.17 kg/m2 .  BMI Body mass index is 28.17 kg/(m^2).  Wt Readings from Last 3 Encounters:  06/16/14 159 lb (72.122 kg)  04/29/14 160 lb (72.576 kg)  03/11/14 158 lb 12.8 oz (72.031 kg)    General: Pleasant. Well developed, well nourished and in no acute distress.  HEENT: Normal but with missing teeth. Neck: Supple, no JVD, carotid bruits, or masses noted.  Cardiac: Irregular irregular rhythm. Her rate is ok. Outflow murmur noted. No edema.  Respiratory:  Lungs are clear to auscultation bilaterally with normal work of breathing.  GI: Soft and nontender.  MS: No deformity or atrophy. Gait and ROM intact.She is using a walker. Skin: Warm and dry. Color is normal.  Neuro:  Strength and sensation are intact and no gross focal deficits noted.  Psych: Alert, appropriate and with normal affect.   LABORATORY DATA:  EKG:  EKG is ordered today. This demonstrates AF with a controlled VR of 70.  Lab Results  Component Value Date   WBC 4.7 03/11/2014   HGB 11.9* 03/11/2014   HCT 35.8* 03/11/2014   PLT 187.0 03/11/2014   GLUCOSE 88 03/11/2014   CHOL 184 03/11/2014   TRIG 55.0 03/11/2014   HDL 65.20 03/11/2014   LDLCALC 108* 03/11/2014   ALT 10 03/11/2014   AST 15 03/11/2014   NA 133* 03/11/2014   K 4.8 03/11/2014   CL 99 03/11/2014    CREATININE 0.77 03/11/2014   BUN 14 03/11/2014   CO2 30 03/11/2014   TSH 0.82 03/11/2014   INR 1.05 01/19/2013    BNP (last 3 results) No results for input(s): BNP in the last 8760 hours.  ProBNP (last 3 results)  Recent Labs  03/11/14 1621  PROBNP 62.0     Other Studies Reviewed Today:  Echo Study Conclusions from December 2014  - Left ventricle: The cavity size was normal. There was moderate concentric hypertrophy. Systolic function was vigorous. The estimated ejection fraction was in the range of 65% to 70%. Wall motion was normal; there were no regional wall motion abnormalities. Features are consistent with a pseudonormal left ventricular filling pattern, with concomitant abnormal relaxation and increased filling pressure (grade 2 diastolic dysfunction). Doppler parameters are consistent with high ventricular filling pressure. - Aortic valve: There was moderate stenosis. Valve area: 1.48cm^2(VTI). Valve area: 1.43cm^2 (Vmax). - Left atrium: The atrium was mildly dilated. - Tricuspid valve: Moderate regurgitation. - Pulmonary arteries: PA peak pressure: 31mm Hg (S).    Lexiscan Myoview from December 2014 IMPRESSION: 1. Normal LVEF 74%, normal wall motion.  2. Normal ECG not suggestive of ischemia.  3. Normal scan, no perfusion defect.  Tobias AlexanderKatarina Nelson Electronically Signed By: Tobias AlexanderKatarina Nelson On: 01/20/2013 15:40    Assessment/Plan:  FIBRILLATION, ATRIAL  Maintaining back in afib and her rate is controlled and with minimal symptoms On xarelto Will stop amiodarone with goal of rate control long term.  CHRONIC DIASTOLIC HEART FAILURE  euvolemic  No changes  Chest pain No recurrence reported  AS Stable without indication for surgery  Falls Will need to follow closely -  she does remain on Xarelto.  Current medicines are reviewed with the patient today.  The patient does not have concerns regarding medicines other than what has been noted  above.  The following changes have been made:  See above.  Labs/ tests ordered today include:    Orders Placed This Encounter  Procedures  . EKG 12-Lead     Disposition:   FU with Dr. Johney Smith in 3 months.   Patient is agreeable to this plan and will call if any problems develop in the interim.   Signed: Rosalio Macadamia, RN, ANP-C 06/16/2014 10:58 AM  [facility] [address] [place], [place]  [address] Phone: [phone] Fax: [phone]

## 2014-08-17 ENCOUNTER — Encounter: Payer: Self-pay | Admitting: Internal Medicine

## 2014-08-17 ENCOUNTER — Ambulatory Visit (INDEPENDENT_AMBULATORY_CARE_PROVIDER_SITE_OTHER): Payer: Medicare HMO | Admitting: Internal Medicine

## 2014-08-17 VITALS — BP 90/52 | HR 80 | Ht 64.0 in | Wt 153.4 lb

## 2014-08-17 DIAGNOSIS — I10 Essential (primary) hypertension: Secondary | ICD-10-CM

## 2014-08-17 DIAGNOSIS — R42 Dizziness and giddiness: Secondary | ICD-10-CM | POA: Diagnosis not present

## 2014-08-17 DIAGNOSIS — I480 Paroxysmal atrial fibrillation: Secondary | ICD-10-CM | POA: Diagnosis not present

## 2014-08-17 LAB — BASIC METABOLIC PANEL
BUN: 12 mg/dL (ref 6–23)
CALCIUM: 9.2 mg/dL (ref 8.4–10.5)
CHLORIDE: 101 meq/L (ref 96–112)
CO2: 28 meq/L (ref 19–32)
Creatinine, Ser: 0.58 mg/dL (ref 0.40–1.20)
GFR: 106.26 mL/min (ref >60.00)
GLUCOSE: 95 mg/dL (ref 70–99)
Potassium: 4.5 mEq/L (ref 3.5–5.1)
Sodium: 134 mEq/L — ABNORMAL LOW (ref 135–145)

## 2014-08-17 LAB — CBC WITH DIFFERENTIAL/PLATELET
Basophils Absolute: 0 10*3/uL (ref 0.0–0.1)
Basophils Relative: 0.4 % (ref 0.0–3.0)
Eosinophils Absolute: 0.1 10*3/uL (ref 0.0–0.7)
Eosinophils Relative: 2.2 % (ref 0.0–5.0)
HCT: 40 % (ref 36.0–46.0)
Hemoglobin: 13 g/dL (ref 12.0–15.0)
Lymphocytes Relative: 27.4 % (ref 12.0–46.0)
Lymphs Abs: 1.3 10*3/uL (ref 0.7–4.0)
MCHC: 32.6 g/dL (ref 30.0–36.0)
MCV: 95 fl (ref 78.0–100.0)
Monocytes Absolute: 0.4 10*3/uL (ref 0.1–1.0)
Monocytes Relative: 9.5 % (ref 3.0–12.0)
Neutro Abs: 2.9 10*3/uL (ref 1.4–7.7)
Neutrophils Relative %: 60.5 % (ref 43.0–77.0)
Platelets: 171 10*3/uL (ref 150.0–400.0)
RBC: 4.21 Mil/uL (ref 3.87–5.11)
RDW: 14.4 % (ref 11.5–15.5)
WBC: 4.7 10*3/uL (ref 4.0–10.5)

## 2014-08-17 NOTE — Patient Instructions (Signed)
Medication Instructions:  Your physician has recommended you make the following change in your medication:  1) Stop Lisinopril    Labwork: Your physician recommends that you return for lab work today: CBC/BMP   Testing/Procedures: None ordered  Follow-Up: Your physician recommends that you schedule a follow-up appointment in: 2 months with Norma Fredrickson, NP   Any Other Special Instructions Will Be Listed Below (If Applicable).  Hydrate

## 2014-08-17 NOTE — Progress Notes (Signed)
The patient presents today for routine electrophysiology followup.   She is tolerating afib wtihout symptoms.  She has had some recent dizziness.  BP is low today. Today, she denies symptoms of palpitations, exertional CP, SOB above baseline, orthopnea, PND,  presyncope, syncope, or neurologic sequela.  The patient feels that she is tolerating medications without difficulties and is otherwise without complaint today.   Past Medical History  Diagnosis Date  . Atrial fibrillation     a.03/2009 DCCV;  b. 11/2010 DCCV;  c. maintained on amio 100mg  daily and xarelto.  . Sinus bradycardia   . Edema   . DDD (degenerative disc disease)   . HTN (hypertension)   . Depression, reactive, psychotic   . Mitral regurgitation     a. 03/2010 Echo: EF 60-65%, no rwma, gr 2 dd, mild MR, mild bi-atrial enlargement.  . Chronic pain   . Tardive dyskinesia   . COPD (chronic obstructive pulmonary disease)   . GERD (gastroesophageal reflux disease)   . Anemia      S/p egd/colon 12/11 ->no source for anemia found. Capsule enteroscopy vs heme consult being considered.  . Chest pain     a. 07/2011 MV: EF 65%, soft tissue attenuation, no ischemia. b. 12/2012: normal nuc.   Marland Kitchen Aortic stenosis     a. 12/2012 echo - mild-mod.  . Schizophrenia    Past Surgical History  Procedure Laterality Date  . Laminectomy      x 2, 2nd one w/ fusion  . Cesarean section    . Cardioversion  12/02/2010    Procedure: CARDIOVERSION;  Surgeon: Marca Ancona, MD;  Location: [facility] OR;  Service: Cardiovascular;  Laterality: N/A;  OUTPATIENT CARDIOVERSION  . Cataract surgery      march and June 2014  . Hernia repair    . Esophagogastroduodenoscopy (egd) with propofol N/A 01/02/2013    Procedure: ESOPHAGOGASTRODUODENOSCOPY (EGD) WITH PROPOFOL;  Surgeon: Shirley Friar, MD;  Location: [facility];  Service: Endoscopy;  Laterality: N/A;  . Colonoscopy with propofol N/A 01/02/2013    Procedure: COLONOSCOPY WITH PROPOFOL;  Surgeon: Shirley Friar, MD;  Location: [facility];  Service: Endoscopy;  Laterality: N/A;     Current outpatient prescriptions:  .  albuterol (PROVENTIL HFA;VENTOLIN HFA) 108 (90 BASE) MCG/ACT inhaler, Inhale 2 puffs into the lungs every 4 (four) hours as needed for wheezing or shortness of breath., Disp: , Rfl:  .  Alpha-Lipoic Acid 100 MG CAPS, Take 1 capsule by mouth 3 (three) times daily., Disp: , Rfl:  .  ALPRAZolam (XANAX) 1 MG tablet, Take 1 mg by mouth at bedtime., Disp: , Rfl:  .  Ascorbic Acid (VITAMIN C) 100 MG tablet, Take 1 tablet by mouth before breakfast and 1 tablet by mouth at bedtime, Disp: , Rfl:  .  Cholecalciferol (VITAMIN D) 2000 UNITS tablet, Take 2,000 Units by mouth daily., Disp: , Rfl:  .  docusate sodium (COLACE) 100 MG capsule, Take 100 mg by mouth every morning., Disp: , Rfl:  .  FERROUS SULFATE ER PO, Take 100 mg by mouth before breakfast and take 100 mg by mouth at bedtime, Disp: , Rfl:  .  fexofenadine (ALLEGRA) 180 MG tablet, Take 180 mg by mouth daily., Disp: , Rfl:  .  Flavocoxid (LIMBREL PO), Take 500 mg by mouth 2 (two) times daily. , Disp: , Rfl:  .  fluPHENAZine (PROLIXIN) 2.5 MG tablet, Take 2.5 mg by mouth at bedtime. , Disp: , Rfl:  .  gabapentin (NEURONTIN) 300  MG capsule, Take 300 mg by mouth 3 (three) times daily. , Disp: , Rfl:  .  guaiFENesin (MUCINEX) 600 MG 12 hr tablet, Take 1,200 mg by mouth every 12 (twelve) hours as needed (chest congestion)., Disp: , Rfl:  .  HYDROmorphone HCl (EXALGO) 8 MG T24A SR tablet, Take 8 mg by mouth daily., Disp: , Rfl:  .  iloperidone (FANAPT) 4 MG TABS tablet, Take 4 mg by mouth at bedtime., Disp: , Rfl:  .  lidocaine (LIDODERM) 5 %, Place 1 patch onto the skin as needed (for pain). Remove & Discard patch within 12 hours or as directed by MD, Disp: , Rfl:  .  Methylfol-Methylcob-Acetylcyst (CEREFOLIN NAC) 6-2-600 MG TABS, Take 1 tablet by mouth 2 (two) times daily. , Disp: , Rfl:  .  nitroGLYCERIN (NITROSTAT) 0.4 MG SL  tablet, Place 0.4 mg under the tongue every 5 (five) minutes as needed for chest pain., Disp: , Rfl:  .  OVER THE COUNTER MEDICATION, Take 2 capsules by mouth daily. Cardilast - avocado soy supplement., Disp: , Rfl:  .  oxyCODONE-acetaminophen (PERCOCET) 5-325 MG per tablet, Take 1 tablet by mouth 3 (three) times daily. Takes extra dose at 3 am if needed for pain, Disp: , Rfl:  .  pantoprazole (PROTONIX) 40 MG tablet, Take 1 tablet (40 mg total) by mouth daily., Disp: 90 tablet, Rfl: 3 .  polyethylene glycol (MIRALAX / GLYCOLAX) packet, Take 17 g by mouth daily as needed for mild constipation. , Disp: , Rfl:  .  Psyllium (METAMUCIL PO), Take 1 capsule by mouth daily as needed (fiber). , Disp: , Rfl:  .  rivaroxaban (XARELTO) 20 MG TABS tablet, Take 1 tablet (20 mg total) by mouth daily., Disp: 90 tablet, Rfl: 3 .  senna (SENOKOT) 8.6 MG tablet, Take 2 tablets by mouth at bedtime. , Disp: , Rfl:  .  traZODone (DESYREL) 150 MG tablet, Take 300 mg by mouth at bedtime., Disp: , Rfl:   Allergies  Allergen Reactions  . Cleocin [Clindamycin Hcl]     Gi upset  . Demerol     Gi upset  . Erythromycin     Gi upset  . Meperidine Hcl     GI upset  . Penicillins Rash    History   Social History  . Marital Status: Married    Spouse Name: [name]  . Number of Children: 2  . Years of Education: 16   Occupational History  . Retired Engineer, civil (consulting)    Social History Main Topics  . Smoking status: Former Smoker    Quit date: 12/26/2009  . Smokeless tobacco: Never Used  . Alcohol Use: No     Comment: None  . Drug Use: No  . Sexual Activity: Not on file   Other Topics Concern  . Not on file   Social History Narrative   Married.  Resident of [employer], [place] side.  Nurses do administer her medication.  Ambulates with a walker.  Meals are provided.    Family History  Problem Relation Age of Onset  . Arrhythmia Neg Hx   . Cancer Sister   . Alcoholism Father      Physical Exam: Filed Vitals:   08/17/14 1132  BP: 90/52  Pulse: 80  Height:  (1.626 m)  Weight: 69.582 kg (153 lb 6.4 oz)    GEN- The patient is well appearing, alert and oriented x 3 today.   Head- normocephalic, atraumatic Eyes-  Sclera clear, conjunctiva pink Ears-  hearing intact Oropharynx- clear Neck- supple, no JVP Lymph- no cervical lymphadenopathy Lungs- Clear to ausculation bilaterally, normal work of breathing Heart- irregular rate and rhythm, 2/6 SEM LUSB mid peaking GI- soft, NT, ND, + BS Extremities- no clubbing, cyanosis, or edema Neuro- moderate resting tremor is noted  EKG today reveals afib  FIBRILLATION, ATRIAL  afib but rate controlled and with minimal symptoms On xarelto Continue rate control long term Bmet/ cbc today  CHRONIC DIASTOLIC HEART FAILURE  euvolemic  No changes  Chest pain we will treat conservatively at this time  AS Stable without indication for surgery No change required today  Dizziness Hypotensive today Stop lisinopril Check bmet/ cbc Adequate hydration encouraged May benefit from reduction in narcotics.  I have encouraged discussion with PCP  Return to see Lawson Fiscal in 2 months I will when needed

## 2014-09-26 IMAGING — CT CT ENTEROGRAPHY (ABD-PELV W/ CM)
2 of 7 series · 16 of 46 positions shown, 18 images · IV contrast (VOLUMEN & [ID] OMNI 300)
Comparison: 03/12/2009

CLINICAL DATA: Chronic constipation and low hemoglobin.

EXAM:
CT ABDOMEN AND PELVIS WITH CONTRAST (ENTEROGRAPHY)
TECHNIQUE: Multidetector CT of the abdomen and pelvis during bolus
administration of intravenous contrast. Negative oral contrast
VoLumen was given.
CONTRAST:  100mL OMNIPAQUE IOHEXOL 300 MG/ML  SOLN, 9397 cc Volumen

[Series 4: thin recons · axial · 0.68mm/px · z∈[-420,-39]mm · 13 of 433 slices shown, 15 images]
[im 26/433  soft-tissue]
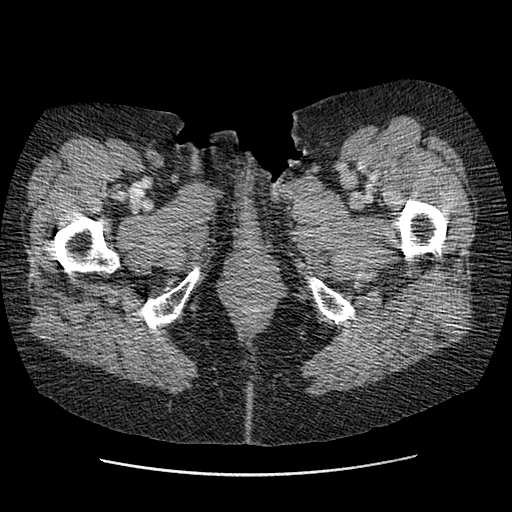
[im 26/433  bone]
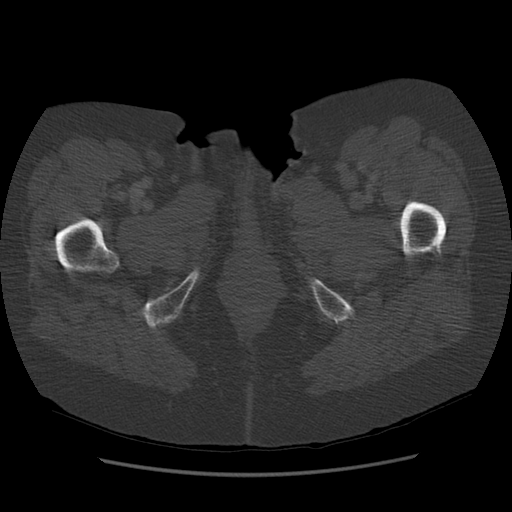
[im 51/433  soft-tissue]
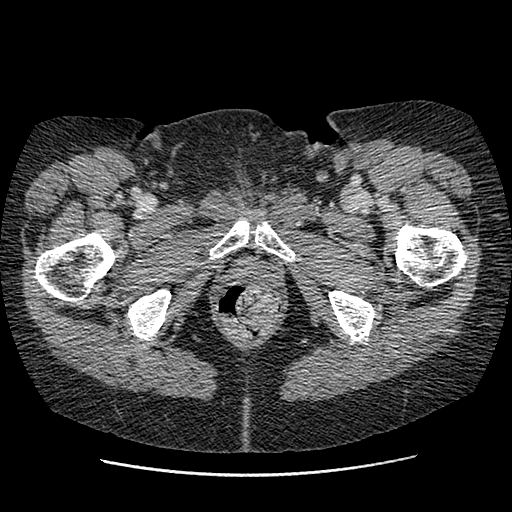
[im 102/433  soft-tissue]
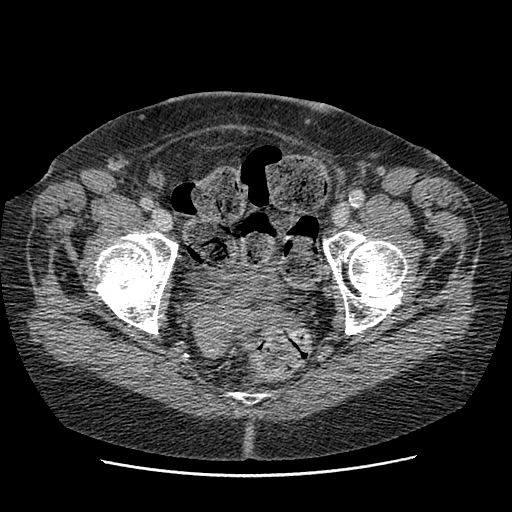
[im 128/433  soft-tissue]
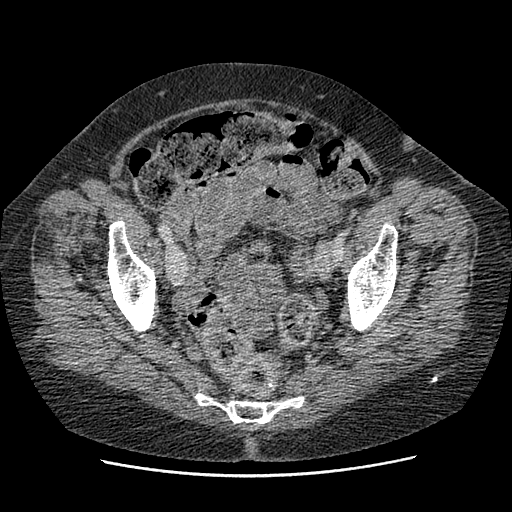
[im 153/433  soft-tissue]
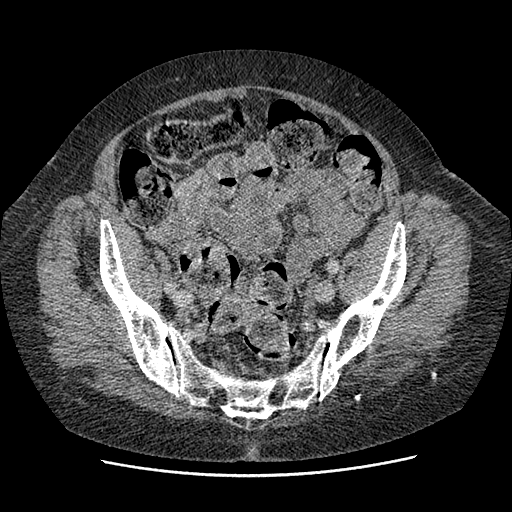
[im 178/433  soft-tissue]
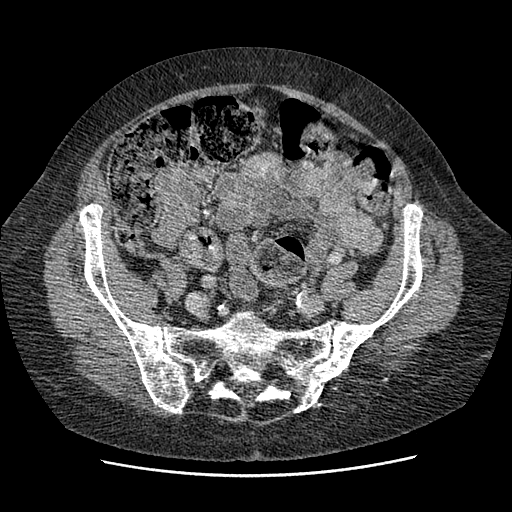
[im 229/433  soft-tissue]
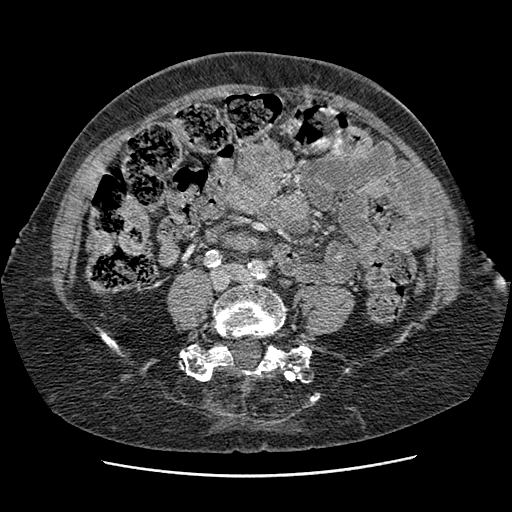
[im 255/433  soft-tissue]
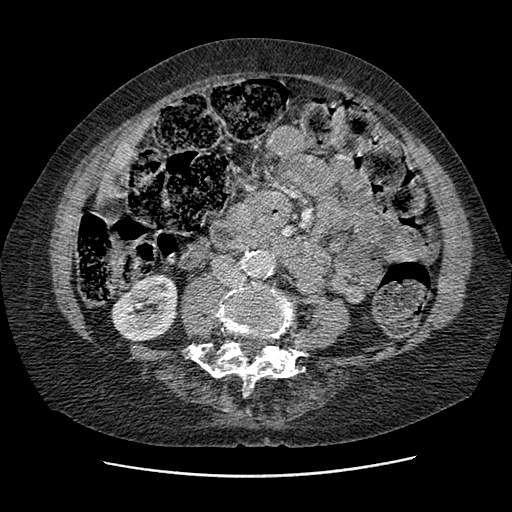
[im 280/433  soft-tissue]
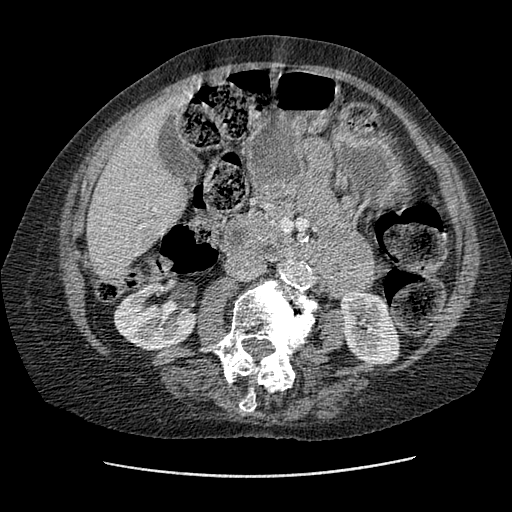
[im 280/433  bone]
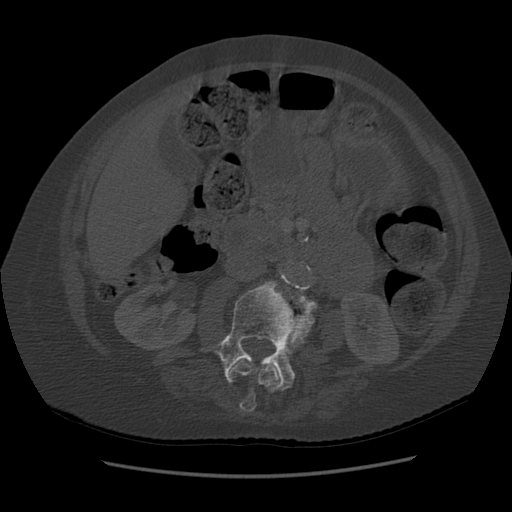
[im 305/433  soft-tissue]
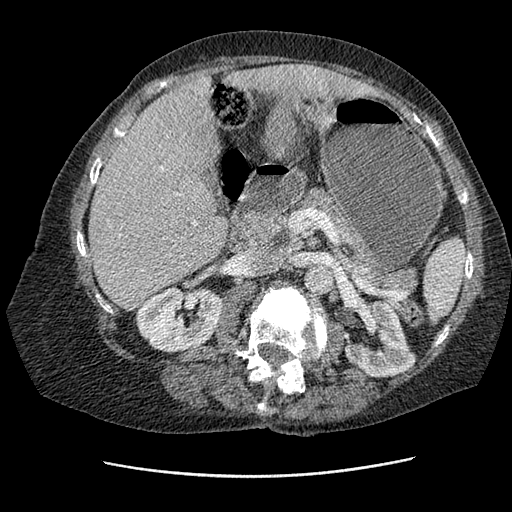
[im 331/433  soft-tissue]
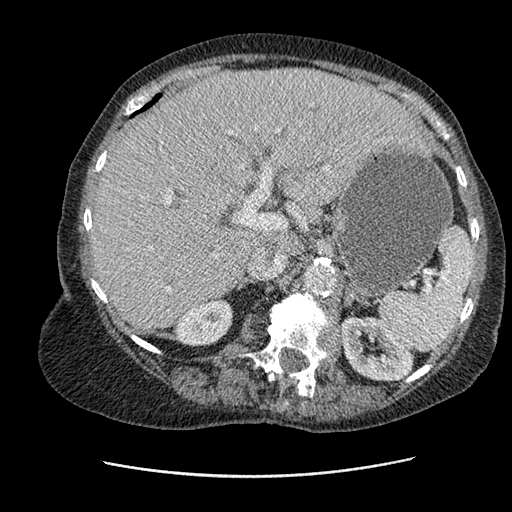
[im 382/433  soft-tissue]
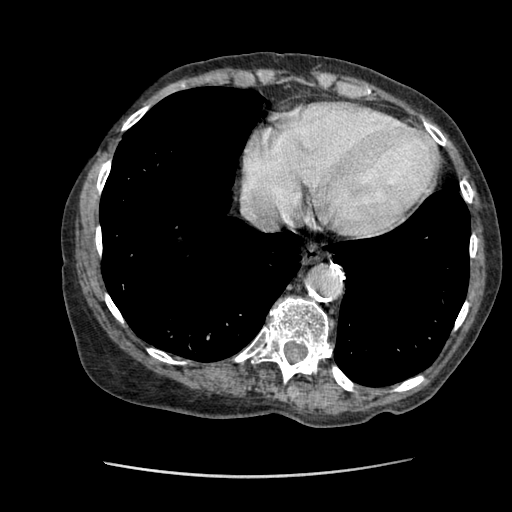
[im 407/433  soft-tissue]
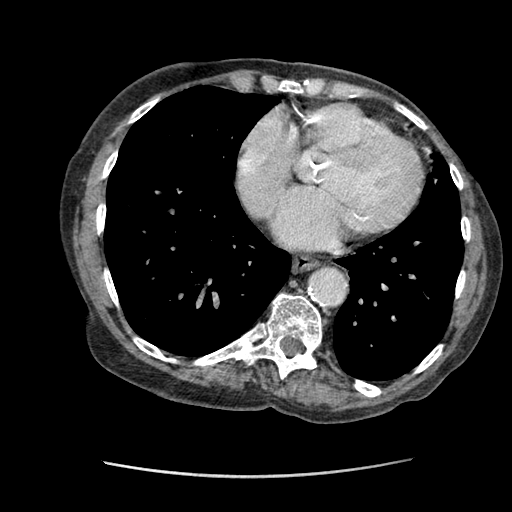

[Series 400: cor · coronal · 0.87mm/px · 3 of 145 slices shown]
[im 49/145  soft-tissue]
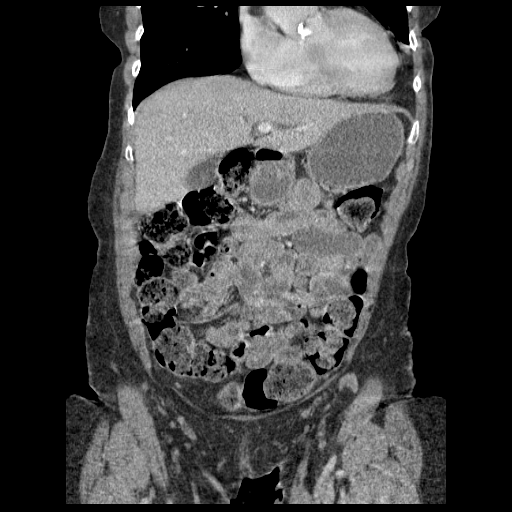
[im 65/145  soft-tissue]
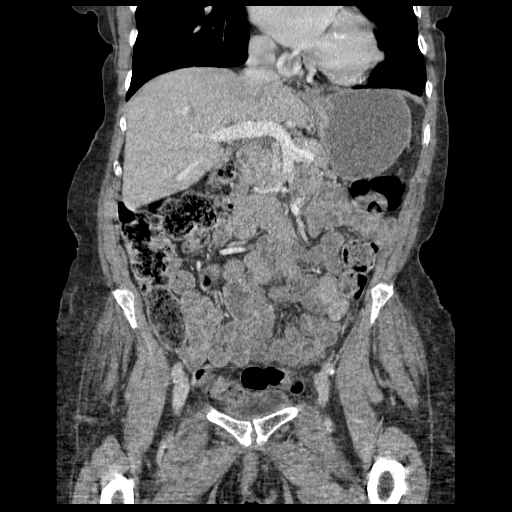
[im 81/145  soft-tissue]
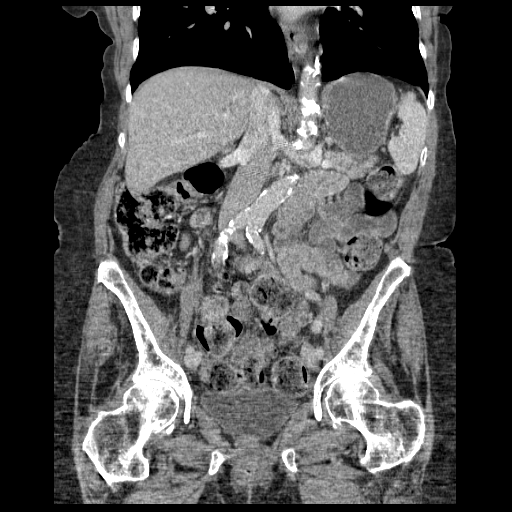

[16 of 46 positions shown; findings below may reference images not displayed]

FINDINGS: Stable right middle lobe subpleural nodule measuring 4 mm, image 6/
series 102. Right lower lobe nodule is also unchanged measuring 4
mm, image 5/ series 102. There is no pleural or pericardial
effusion.

There is no focal liver abnormality identified. The gallbladder
appears normal. There is mild intrahepatic bile duct dilatation. The
common bile duct is increased in caliber measuring up to 9 mm, image
79/series 400. This is similar to the previous exam. No obstructing
stone or mass identified. The pancreas is on unremarkable. Normal
appearance of the spleen.

The adrenal glands are both normal. Normal appearance of the right
kidney. There is a small hypodense structure within the inferior
pole of the left kidney which measures 5 mm, image 67/series 2.

The urinary bladder appears normal. Calcified atherosclerotic
disease affects the abdominal aorta and its branches. There is no
upper abdominal adenopathy. No pelvic or inguinal adenopathy.

The stomach appears normal. The small bowel loops have a normal
course and caliber. No evidence for bowel obstruction. No abnormal
enhancing filling defects identified within the lumen of the small
bowel. There is a moderate stool burden identified throughout the
colon. No obstructing mass identified.

Review of the visualized osseous structures is significant for
osteopenia. There is a scoliosis deformity involving the thoracic
and lumbar spine which is convex towards the left. Multi level disc
space narrowing and endplate spurring is noted consistent with
degenerative disc disease.
IMPRESSION: 1. No findings identified within the small bowel loops to account
for the patient's low hemoglobin.
2. Chronic dilatation of the common bile duct. No obstructing stone
identified.

## 2014-10-21 ENCOUNTER — Ambulatory Visit (INDEPENDENT_AMBULATORY_CARE_PROVIDER_SITE_OTHER): Payer: Medicare HMO | Admitting: Nurse Practitioner

## 2014-10-21 ENCOUNTER — Encounter: Payer: Self-pay | Admitting: Nurse Practitioner

## 2014-10-21 VITALS — BP 118/70 | HR 75 | Ht 64.0 in | Wt 150.8 lb

## 2014-10-21 DIAGNOSIS — I10 Essential (primary) hypertension: Secondary | ICD-10-CM | POA: Diagnosis not present

## 2014-10-21 DIAGNOSIS — I5032 Chronic diastolic (congestive) heart failure: Secondary | ICD-10-CM | POA: Diagnosis not present

## 2014-10-21 DIAGNOSIS — I480 Paroxysmal atrial fibrillation: Secondary | ICD-10-CM

## 2014-10-21 MED ORDER — NITROGLYCERIN 0.4 MG SL SUBL: 0.4000 mg | SUBLINGUAL_TABLET | SUBLINGUAL | Status: AC | PRN

## 2014-10-21 NOTE — Patient Instructions (Addendum)
We will be checking the following labs today - NONE   Medication Instructions:    Continue with your current medicines.   I refilled your NTG today    Testing/Procedures To Be Arranged:  N/A  Follow-Up:   See me in 3 months    Other Special Instructions:   N/A  Call the [facility] office at [phone] if you have any questions, problems or concerns.

## 2014-10-21 NOTE — Progress Notes (Signed)
CARDIOLOGY OFFICE NOTE  Date:  10/21/2014     [name] Date of Birth: 04/17/1932 Medical Record #[id number]  PCP:  Thora Lance, MD  Cardiologist:  Allred    Chief Complaint  Patient presents with  . Atrial Fibrillation    Follow up visit - seen for Dr. Johney Frame    History of Present Illness: Kathryn Smith is a 81 y.o. female who presents today for a **She is seen for Dr. Johney Frame. She has multiple medical problems. These include atrial fibrillation, prior cardioversion in 2012, chronic Amiodarone therapy, chronic anti-coagulation with Xarelto, chronic diastolic heart failure, hypertension, and depression. Her last Myoview was in December of 2014 which showed normal perfusion and normal ejection fraction. Her last echocardiogram in December of 2014 showed moderate LVH, normal ejection fraction, grade 2 diastolic dysfunction and moderate aortic stenosis.  She has had her amiodarone stopped due to chronic AF. Off ACE since last visit due to low BP.  She comes in today. She is here with her daughter. She is not doing well. Her grandson died very unexpectantly 2 months ago - had an enlarged heart. She continues to grieve. She did have chest pain off and on for about 3 weeks after his death - she would take NTG with good relief. Needs refills. She tries to be active. She has lost weight. No awareness of her AF. No recent falls but very unsteady.  Daughter has pulled me aside to tell me that Mahalie's son has stage IV lung cancer - Karena Addison DOES NOT KNOW THIS YET.   Past Medical History  Diagnosis Date  . Atrial fibrillation     a.03/2009 DCCV;  b. 11/2010 DCCV;  c. maintained on amio  daily and xarelto.  . Sinus bradycardia   . Edema   . DDD (degenerative disc disease)   . HTN (hypertension)   . Depression, reactive, psychotic   . Mitral regurgitation     a. 03/2010 Echo: EF 60-65%, no rwma, gr 2 dd, mild MR, mild bi-atrial enlargement.  . Chronic pain   . Tardive dyskinesia    . COPD (chronic obstructive pulmonary disease)   . GERD (gastroesophageal reflux disease)   . Anemia      S/p egd/colon 12/11 ->no source for anemia found. Capsule enteroscopy vs heme consult being considered.  . Chest pain     a. 07/2011 MV: EF 65%, soft tissue attenuation, no ischemia. b. 12/2012: normal nuc.   Marland Kitchen Aortic stenosis     a. 12/2012 echo - mild-mod.  . Schizophrenia     Past Surgical History  Procedure Laterality Date  . Laminectomy      x 2, 2nd one w/ fusion  . Cesarean section    . Cardioversion  12/02/2010    Procedure: CARDIOVERSION;  Surgeon: Marca Ancona, MD;  Location: [facility];  Service: Cardiovascular;  Laterality: N/A;  OUTPATIENT CARDIOVERSION  . Cataract surgery      march and June 2014  . Hernia repair    . Esophagogastroduodenoscopy (egd) with propofol N/A 01/02/2013    Procedure: ESOPHAGOGASTRODUODENOSCOPY (EGD) WITH PROPOFOL;  Surgeon: Shirley Friar, MD;  Location: [facility] ENDOSCOPY;  Service: Endoscopy;  Laterality: N/A;  . Colonoscopy with propofol N/A 01/02/2013    Procedure: COLONOSCOPY WITH PROPOFOL;  Surgeon: Shirley Friar, MD;  Location: [facility] ENDOSCOPY;  Service: Endoscopy;  Laterality: N/A;     Medications: Current Outpatient Prescriptions  Medication Sig Dispense Refill  . albuterol (PROVENTIL HFA;VENTOLIN HFA) 108 (90 BASE) MCG/ACT  inhaler Inhale 2 puffs into the lungs every 4 (four) hours as needed for wheezing or shortness of breath.    . Alpha-Lipoic Acid 100 MG CAPS Take 1 capsule by mouth 3 (three) times daily.    Marland Kitchen ALPRAZolam (XANAX) 1 MG tablet Take 1 mg by mouth at bedtime.    . Ascorbic Acid (VITAMIN C) 100 MG tablet Take 1 tablet by mouth before breakfast and 1 tablet by mouth at bedtime    . Cholecalciferol (VITAMIN D) 2000 UNITS tablet Take 2,000 Units by mouth daily.    Marland Kitchen docusate sodium (COLACE) 100 MG capsule Take 100 mg by mouth every morning.    Marland Kitchen FERROUS SULFATE ER PO Take 100 mg by mouth before breakfast and take 100  mg by mouth at bedtime    . fexofenadine (ALLEGRA) 180 MG tablet Take 180 mg by mouth daily.    . Flavocoxid (LIMBREL PO) Take 500 mg by mouth 2 (two) times daily.     . fluPHENAZine (PROLIXIN) 2.5 MG tablet Take 2.5 mg by mouth at bedtime.     . gabapentin (NEURONTIN) 300 MG capsule Take 300 mg by mouth 3 (three) times daily.     Marland Kitchen guaiFENesin (MUCINEX) 600 MG 12 hr tablet Take 1,200 mg by mouth every 12 (twelve) hours as needed (chest congestion).    Marland Kitchen HYDROmorphone HCl (EXALGO) 8 MG T24A SR tablet Take 8 mg by mouth daily.    Marland Kitchen iloperidone (FANAPT) 4 MG TABS tablet Take 4 mg by mouth at bedtime.    . lidocaine (LIDODERM) 5 % Place 1 patch onto the skin as needed (for pain). Remove & Discard patch within 12 hours or as directed by MD    . Methylfol-Methylcob-Acetylcyst (CEREFOLIN NAC) 6-2-600 MG TABS Take 1 tablet by mouth 2 (two) times daily.     . nitroGLYCERIN (NITROSTAT) 0.4 MG SL tablet Place 1 tablet (0.4 mg total) under the tongue every 5 (five) minutes as needed for chest pain. 25 tablet 6  . OVER THE COUNTER MEDICATION Take 2 capsules by mouth daily. Cardilast - avocado soy supplement.    Marland Kitchen oxyCODONE-acetaminophen (PERCOCET) 5-325 MG per tablet Take 1 tablet by mouth 3 (three) times daily. Takes extra dose at 3 am if needed for pain    . pantoprazole (PROTONIX) 40 MG tablet Take 1 tablet (40 mg total) by mouth daily. 90 tablet 3  . polyethylene glycol (MIRALAX / GLYCOLAX) packet Take 17 g by mouth daily as needed for mild constipation.     . Psyllium (METAMUCIL PO) Take 1 capsule by mouth daily as needed (fiber).     . rivaroxaban (XARELTO) 20 MG TABS tablet Take 1 tablet (20 mg total) by mouth daily. 90 tablet 3  . senna (SENOKOT) 8.6 MG tablet Take 2 tablets by mouth at bedtime.     . traZODone (DESYREL) 150 MG tablet Take 300 mg by mouth at bedtime.     No current facility-administered medications for this visit.    Allergies: Allergies  Allergen Reactions  . Cleocin  [Clindamycin Hcl]     Gi upset  . Demerol     Gi upset  . Erythromycin     Gi upset  . Meperidine Hcl     GI upset  . Penicillins Rash    Social History: The patient  reports that she quit smoking about 4 years ago. She has never used smokeless tobacco. She reports that she does not drink alcohol or use illicit drugs.  Family History: The patient's family history includes Alcoholism in her father; Cancer in her sister. There is no history of Arrhythmia.   Review of Systems: Please see the history of present illness.   Otherwise, the review of systems is positive for none.   All other systems are reviewed and negative.   Physical Exam: VS:  BP 118/70 mmHg  Pulse 75  Ht 5\' 4"  (1.626 m)  Wt 150 lb 12.8 oz (68.402 kg)  BMI 25.87 kg/m2  SpO2 95% .  BMI Body mass index is 25.87 kg/(m^2).  Wt Readings from Last 3 Encounters:  10/21/14 150 lb 12.8 oz (68.402 kg)  08/17/14 153 lb 6.4 oz (69.582 kg)  06/16/14 159 lb (72.122 kg)    General: Pleasant. Well developed, well nourished and in no acute distress.  HEENT: Normal. Neck: Supple, no JVD, carotid bruits, or masses noted.  Cardiac: Irregular irregular rhythm. Rate is ok. No edema.  Respiratory:  Lungs are clear to auscultation bilaterally with normal work of breathing.  GI: Soft and nontender.  MS: No deformity or atrophy. Gait and ROM intact. Using a walker.  Skin: Warm and dry. Color is normal.  Neuro:  Strength and sensation are intact and no gross focal deficits noted.  Psych: Alert, appropriate and with normal affect.   LABORATORY DATA:  EKG:  EKG is not ordered today.  Lab Results  Component Value Date   WBC 4.7 08/17/2014   HGB 13.0 08/17/2014   HCT 40.0 08/17/2014   PLT 171.0 08/17/2014   GLUCOSE 95 08/17/2014   CHOL 184 03/11/2014   TRIG 55.0 03/11/2014   HDL 65.20 03/11/2014   LDLCALC 108* 03/11/2014   ALT 10 03/11/2014   AST 15 03/11/2014   NA 134* 08/17/2014   K 4.5 08/17/2014   CL 101 08/17/2014    CREATININE 0.58 08/17/2014   BUN 12 08/17/2014   CO2 28 08/17/2014   TSH 0.82 03/11/2014   INR 1.05 01/19/2013    BNP (last 3 results) No results for input(s): BNP in the last 8760 hours.  ProBNP (last 3 results)  Recent Labs  03/11/14 1621  PROBNP 62.0     Other Studies Reviewed Today:   Echo Study Conclusions from December 2014  - Left ventricle: The cavity size was normal. There was moderate concentric hypertrophy. Systolic function was vigorous. The estimated ejection fraction was in the range of 65% to 70%. Wall motion was normal; there were no regional wall motion abnormalities. Features are consistent with a pseudonormal left ventricular filling pattern, with concomitant abnormal relaxation and increased filling pressure (grade 2 diastolic dysfunction). Doppler parameters are consistent with high ventricular filling pressure. - Aortic valve: There was moderate stenosis. Valve area: 1.48cm^2(VTI). Valve area: 1.43cm^2 (Vmax). - Left atrium: The atrium was mildly dilated. - Tricuspid valve: Moderate regurgitation. - Pulmonary arteries: PA peak pressure: 31mm Hg (S).    Lexiscan Myoview from December 2014 IMPRESSION: 1. Normal LVEF 74%, normal wall motion.  2. Normal ECG not suggestive of ischemia.  3. Normal scan, no perfusion defect.  Tobias Alexander Electronically Signed By: Tobias Alexander On: 01/20/2013 15:40    Assessment/Plan:  FIBRILLATION, ATRIAL  Maintaining back in afib and her rate is controlled and with no symptoms On xarelto   CHRONIC DIASTOLIC HEART FAILURE  euvolemic  No changes  Chest pain Most likely due to grief. Refill NTG. If recurs may need to change her medicines. Would try to manage conservatively.   AS Stable without indication for surgery  Falls Will need to follow closely - she does remain on Xarelto. None recently.    Current medicines are reviewed with the patient today.  The patient does not have  concerns regarding medicines other than what has been noted above.  The following changes have been made:  See above.  Labs/ tests ordered today include:   No orders of the defined types were placed in this encounter.     Disposition:   FU with me in 3  months.   Patient is agreeable to this plan and will call if any problems develop in the interim.   Signed: Rosalio Macadamia, RN, ANP-C 10/21/2014 3:06 PM  [facility] [address] [place], [place]  [address] Phone: [phone] Fax: [phone]

## 2014-10-22 ENCOUNTER — Telehealth: Payer: Self-pay | Admitting: Nurse Practitioner

## 2014-10-22 NOTE — Telephone Encounter (Signed)
New message      Daughter request to talk to Kathryn Smith only.  She is upset about a confidential note being given to the person that she did not want it given to------something like that.

## 2014-10-23 NOTE — Telephone Encounter (Signed)
I have left a message for Kathryn Smith to call me back to discuss this issue. If was never my intention for Ms. Putz to open a sealed envelope and read my office note. This was for the facility's records. I have asked her to call me back.   Rosalio Macadamia, RN, ANP-C [facility] [address] [place], [place]  [address] [phone]

## 2015-01-12 ENCOUNTER — Ambulatory Visit: Payer: Medicare HMO | Admitting: Nurse Practitioner

## 2015-01-24 DEATH — deceased

## 2015-02-01 ENCOUNTER — Ambulatory Visit: Payer: Medicare HMO | Admitting: Physician Assistant
